# Patient Record
Sex: Male | Born: 1955 | Hispanic: No | Marital: Married | Smoking: Never smoker
Health system: Southern US, Community
[De-identification: ages and names within clinical notes are randomized; demographics above are authoritative.]

## PROBLEM LIST (undated history)

## (undated) DIAGNOSIS — R0600 Dyspnea, unspecified: Secondary | ICD-10-CM

## (undated) DIAGNOSIS — S82842A Displaced bimalleolar fracture of left lower leg, initial encounter for closed fracture: Secondary | ICD-10-CM

## (undated) DIAGNOSIS — E785 Hyperlipidemia, unspecified: Secondary | ICD-10-CM

## (undated) DIAGNOSIS — I10 Essential (primary) hypertension: Secondary | ICD-10-CM

## (undated) DIAGNOSIS — G4730 Sleep apnea, unspecified: Secondary | ICD-10-CM

## (undated) DIAGNOSIS — R0609 Other forms of dyspnea: Secondary | ICD-10-CM

## (undated) DIAGNOSIS — E119 Type 2 diabetes mellitus without complications: Secondary | ICD-10-CM

## (undated) HISTORY — DX: Essential (primary) hypertension: I10

## (undated) HISTORY — DX: Dyspnea, unspecified: R06.00

## (undated) HISTORY — DX: Type 2 diabetes mellitus without complications: E11.9

## (undated) HISTORY — DX: Other forms of dyspnea: R06.09

## (undated) HISTORY — DX: Hyperlipidemia, unspecified: E78.5

## (undated) HISTORY — PX: ANKLE SURGERY: SHX546

---

## 2006-10-06 ENCOUNTER — Ambulatory Visit: Admission: RE | Admit: 2006-10-06 | Discharge: 2006-10-06 | Payer: Self-pay | Admitting: Family Medicine

## 2006-10-18 ENCOUNTER — Ambulatory Visit: Payer: Self-pay | Admitting: Pulmonary Disease

## 2013-04-14 ENCOUNTER — Ambulatory Visit (INDEPENDENT_AMBULATORY_CARE_PROVIDER_SITE_OTHER): Payer: BC Managed Care – PPO | Admitting: Cardiovascular Disease

## 2013-04-14 ENCOUNTER — Encounter: Payer: Self-pay | Admitting: Cardiovascular Disease

## 2013-04-14 VITALS — BP 163/90 | HR 77 | Ht 71.0 in | Wt 265.0 lb

## 2013-04-14 DIAGNOSIS — E1159 Type 2 diabetes mellitus with other circulatory complications: Secondary | ICD-10-CM | POA: Insufficient documentation

## 2013-04-14 DIAGNOSIS — R0609 Other forms of dyspnea: Secondary | ICD-10-CM | POA: Insufficient documentation

## 2013-04-14 DIAGNOSIS — E785 Hyperlipidemia, unspecified: Secondary | ICD-10-CM

## 2013-04-14 DIAGNOSIS — E119 Type 2 diabetes mellitus without complications: Secondary | ICD-10-CM | POA: Insufficient documentation

## 2013-04-14 DIAGNOSIS — E1169 Type 2 diabetes mellitus with other specified complication: Secondary | ICD-10-CM | POA: Insufficient documentation

## 2013-04-14 DIAGNOSIS — I10 Essential (primary) hypertension: Secondary | ICD-10-CM | POA: Insufficient documentation

## 2013-04-14 NOTE — Assessment & Plan Note (Signed)
On statin therapy followed by his PCP 

## 2013-04-14 NOTE — Progress Notes (Signed)
04/14/2013 Meet Dowers   [birth year]  [id number]  Primary Physician Alois Cliche, PA-C Primary Cardiologist: Runell Gess MD Roseanne Reno   HPI:    Mr. Waterson a 57 year old moderately overweight married African male father of 4, grandfather to 2 grandchildren he works in the [employer]. He was referred by Dr. Lerry Liner at [facility] Medical clinic for evaluation of dyspnea on exertion. His cardiovascular risk factor profile is remarkable for family history with a father who died of heart-related issues age 44. He does not smoke. He does have a history of hypertension, diabetes and hyperlipidemia. He has never had a heart attack or stroke.    Current Outpatient Prescriptions  Medication Sig Dispense Refill  . amLODipine (NORVASC) 10 MG tablet Take 10 mg by mouth daily.      Marland Kitchen aspirin 81 MG tablet Take 81 mg by mouth daily.      Marland Kitchen atorvastatin (LIPITOR) 40 MG tablet Take 40 mg by mouth daily.      Marland Kitchen glimepiride (AMARYL) 2 MG tablet Take 2 mg by mouth daily before breakfast.      . lisinopril (PRINIVIL,ZESTRIL) 20 MG tablet Take 20 mg by mouth daily.      Marland Kitchen lisinopril-hydrochlorothiazide (PRINZIDE,ZESTORETIC) 20-25 MG per tablet Take 1 tablet by mouth daily.      Marland Kitchen terbinafine (LAMISIL) 250 MG tablet Take 250 mg by mouth daily.       No current facility-administered medications for this visit.    No Known Allergies  History   Social History  . Marital Status: Married    Spouse Name: N/A    Number of Children: N/A  . Years of Education: N/A   Occupational History  . Not on file.   Social History Main Topics  . Smoking status: Never Smoker   . Smokeless tobacco: Not on file  . Alcohol Use: Yes     Comment: 3-4 beers weekly  . Drug Use: No  . Sexual Activity: Not on file   Other Topics Concern  . Not on file   Social History Narrative  . No narrative on file     Review of Systems: General: negative for chills, fever, night sweats or  weight changes.  Cardiovascular: negative for chest pain, dyspnea on exertion, edema, orthopnea, palpitations, paroxysmal nocturnal dyspnea or shortness of breath Dermatological: negative for rash Respiratory: negative for cough or wheezing Urologic: negative for hematuria Abdominal: negative for nausea, vomiting, diarrhea, bright red blood per rectum, melena, or hematemesis Neurologic: negative for visual changes, syncope, or dizziness All other systems reviewed and are otherwise negative except as noted above.    Blood pressure 163/90, pulse 77, height 5\' 11"  (1.803 m), weight 265 lb (120.203 kg).  General appearance: alert and no distress Neck: no adenopathy, no carotid bruit, no JVD, supple, symmetrical, trachea midline and thyroid not enlarged, symmetric, no tenderness/mass/nodules Lungs: clear to auscultation bilaterally Heart: regular rate and rhythm, S1, S2 normal, no murmur, click, rub or gallop Abdomen: soft, non-tender; bowel sounds normal; no masses,  no organomegaly Extremities: extremities normal, atraumatic, no cyanosis or edema Pulses: 2+ and symmetric  EKG normal sinus rhythm at 77 with borderline evidence of left ventricular hypertrophy by voltage  ASSESSMENT AND PLAN:   Essential hypertension Borderline control on current medications. Talked about salt restriction dietary modification and exercise. At this point I'm not going to increase in his angina and hypertensive medications.  Hyperlipidemia On statin therapy followed by his PCP  Dyspnea on exertion  He has cardiac risk factors notable for hypertension, diabetes and hyperlipidemia. I'm going to get a 2-D echo to evaluate LV function.      Runell Gess MD FACP,FACC,FAHA, [facility] 04/14/2013 5:04 PM

## 2013-04-14 NOTE — Assessment & Plan Note (Signed)
Borderline control on current medications. Talked about salt restriction dietary modification and exercise. At this point I'm not going to increase in his angina and hypertensive medications.

## 2013-04-14 NOTE — Assessment & Plan Note (Signed)
He has cardiac risk factors notable for hypertension, diabetes and hyperlipidemia. I'm going to get a 2-D echo to evaluate LV function.

## 2013-04-14 NOTE — Patient Instructions (Signed)
Your physician wants you to follow-up in: 3 months with an extender and 6 months with Dr Allyson Sabal. You will receive a reminder letter in the mail two months in advance. If you don't receive a letter, please call our office to schedule the follow-up appointment.  Dr Allyson Sabal has ordered an echocardiogram.

## 2013-04-17 ENCOUNTER — Encounter: Payer: Self-pay | Admitting: Cardiovascular Disease

## 2013-05-06 ENCOUNTER — Ambulatory Visit (HOSPITAL_COMMUNITY)
Admission: RE | Admit: 2013-05-06 | Discharge: 2013-05-06 | Disposition: A | Discharge status: Home / Self Care | Payer: BC Managed Care – PPO | Source: Ambulatory Visit | Attending: Cardiovascular Disease | Admitting: Cardiovascular Disease

## 2013-05-06 DIAGNOSIS — R0989 Other specified symptoms and signs involving the circulatory and respiratory systems: Secondary | ICD-10-CM | POA: Insufficient documentation

## 2013-05-06 DIAGNOSIS — E119 Type 2 diabetes mellitus without complications: Secondary | ICD-10-CM | POA: Insufficient documentation

## 2013-05-06 DIAGNOSIS — R0609 Other forms of dyspnea: Secondary | ICD-10-CM | POA: Insufficient documentation

## 2013-05-06 DIAGNOSIS — I10 Essential (primary) hypertension: Secondary | ICD-10-CM | POA: Insufficient documentation

## 2013-05-06 DIAGNOSIS — R0602 Shortness of breath: Secondary | ICD-10-CM

## 2013-05-06 NOTE — Progress Notes (Signed)
2D Echo Performed 05/06/2013    Machai Desmith, RCS  

## 2013-05-13 ENCOUNTER — Encounter: Payer: Self-pay | Admitting: *Deleted

## 2013-05-14 ENCOUNTER — Telehealth: Payer: Self-pay | Admitting: *Deleted

## 2013-05-14 NOTE — Telephone Encounter (Signed)
Pt's wife called in regards to his test results. Stated that she would get a call in a couple of days but has not heard

## 2013-05-14 NOTE — Telephone Encounter (Signed)
wife return the call- Result given . Verbalized understanding Letter was mailed.

## 2013-05-14 NOTE — Telephone Encounter (Signed)
Left on message on voicemail  To call back  According to results - echo was essential normal and aletter was mailed om 05/14/13

## 2014-06-28 ENCOUNTER — Emergency Department (HOSPITAL_COMMUNITY): Payer: BLUE CROSS/BLUE SHIELD

## 2014-06-28 ENCOUNTER — Encounter (HOSPITAL_COMMUNITY): Payer: Self-pay | Admitting: *Deleted

## 2014-06-28 ENCOUNTER — Emergency Department (HOSPITAL_COMMUNITY)
Admission: EM | Admit: 2014-06-28 | Discharge: 2014-06-28 | Disposition: A | Discharge status: Home / Self Care | Payer: BLUE CROSS/BLUE SHIELD | Attending: Emergency Medicine | Admitting: Emergency Medicine

## 2014-06-28 DIAGNOSIS — S99912A Unspecified injury of left ankle, initial encounter: Secondary | ICD-10-CM | POA: Diagnosis present

## 2014-06-28 DIAGNOSIS — Z7982 Long term (current) use of aspirin: Secondary | ICD-10-CM | POA: Insufficient documentation

## 2014-06-28 DIAGNOSIS — Y9389 Activity, other specified: Secondary | ICD-10-CM | POA: Diagnosis not present

## 2014-06-28 DIAGNOSIS — S82832A Other fracture of upper and lower end of left fibula, initial encounter for closed fracture: Secondary | ICD-10-CM | POA: Insufficient documentation

## 2014-06-28 DIAGNOSIS — S82302A Unspecified fracture of lower end of left tibia, initial encounter for closed fracture: Secondary | ICD-10-CM | POA: Diagnosis not present

## 2014-06-28 DIAGNOSIS — E119 Type 2 diabetes mellitus without complications: Secondary | ICD-10-CM | POA: Diagnosis not present

## 2014-06-28 DIAGNOSIS — E785 Hyperlipidemia, unspecified: Secondary | ICD-10-CM | POA: Diagnosis not present

## 2014-06-28 DIAGNOSIS — Y92096 Garden or yard of other non-institutional residence as the place of occurrence of the external cause: Secondary | ICD-10-CM | POA: Diagnosis not present

## 2014-06-28 DIAGNOSIS — I10 Essential (primary) hypertension: Secondary | ICD-10-CM | POA: Insufficient documentation

## 2014-06-28 DIAGNOSIS — W010XXA Fall on same level from slipping, tripping and stumbling without subsequent striking against object, initial encounter: Secondary | ICD-10-CM | POA: Diagnosis not present

## 2014-06-28 DIAGNOSIS — Z79899 Other long term (current) drug therapy: Secondary | ICD-10-CM | POA: Insufficient documentation

## 2014-06-28 DIAGNOSIS — Y990 Civilian activity done for income or pay: Secondary | ICD-10-CM | POA: Diagnosis not present

## 2014-06-28 MED ORDER — OXYCODONE-ACETAMINOPHEN 5-325 MG PO TABS: 1.0000 | ORAL_TABLET | Freq: Four times a day (QID) | ORAL | Status: DC | PRN

## 2014-06-28 MED ORDER — OXYCODONE-ACETAMINOPHEN 5-325 MG PO TABS
1.0000 | ORAL_TABLET | Freq: Once | ORAL | Status: AC
  Administered 2014-06-28: 1 via ORAL
  Filled 2014-06-28: qty 1

## 2014-06-28 MED ORDER — HYDROCODONE-ACETAMINOPHEN 5-325 MG PO TABS
1.0000 | ORAL_TABLET | Freq: Once | ORAL | Status: AC
  Administered 2014-06-28: 1 via ORAL
  Filled 2014-06-28: qty 1

## 2014-06-28 NOTE — ED Notes (Signed)
Declined W/C at D/C and was escorted to lobby by RN. 

## 2014-06-28 NOTE — ED Provider Notes (Signed)
CSN: [id number]     Arrival date & time 06/28/14  1302 History  This chart was scribed for Fayrene HelperBowie Jayleana Colberg, PA-C, working with Candyce ChurnJohn David Wofford III, * by Chestine SporeSoijett Blue, ED Scribe. The patient was seen in room TR10C/TR10C at 2:21 PM.    Chief Complaint  Patient presents with  . Fall  . Ankle Pain     The history is provided by the patient. No language interpreter was used.    HPI Comments: Zachary Stephenson is a 59 y.o. male who presents to the Emergency Department complaining of fall onset this morning PTA. He reports that he tripped and fell this morning while doing yardwork on a sloped yard. He notes that he twisted his left ankle. He states that he is having associated symptoms of left ankle pain and joint swelling. He is now having increased pain with ambulation and that is what prompted him to come in. He states that he has not tried any medications for the relief of his symptoms. He denies hitting his head, LOC, and any other symptoms.   Past Medical History  Diagnosis Date  . HTN (hypertension)   . Hyperlipidemia   . Type 2 diabetes mellitus   . Dyspnea on exertion    Past Surgical History  Procedure Laterality Date  . Ankle surgery      right ankle   Family History  Problem Relation Age of Onset  . Heart attack Father    History  Substance Use Topics  . Smoking status: Never Smoker   . Smokeless tobacco: Not on file  . Alcohol Use: Yes     Comment: 3-4 beers weekly    Review of Systems  Musculoskeletal: Positive for myalgias, joint swelling and arthralgias.  Neurological: Negative for syncope.      Allergies  Review of patient's allergies indicates no known allergies.  Home Medications   Prior to Admission medications   Medication Sig Start Date End Date Taking? Authorizing Provider  amLODipine (NORVASC) 10 MG tablet Take 10 mg by mouth daily.    Historical Provider, MD  aspirin 81 MG tablet Take 81 mg by mouth daily.    Historical Provider, MD  atorvastatin  (LIPITOR) 40 MG tablet Take 40 mg by mouth daily.    Historical Provider, MD  glimepiride (AMARYL) 2 MG tablet Take 2 mg by mouth daily before breakfast.    Historical Provider, MD  lisinopril (PRINIVIL,ZESTRIL) 20 MG tablet Take 20 mg by mouth daily.    Historical Provider, MD  lisinopril-hydrochlorothiazide (PRINZIDE,ZESTORETIC) 20-25 MG per tablet Take 1 tablet by mouth daily.    Historical Provider, MD  terbinafine (LAMISIL) 250 MG tablet Take 250 mg by mouth daily.    Historical Provider, MD   BP 148/72 mmHg  Pulse 100  Temp(Src) 98 F (36.7 C) (Oral)  Resp 18  SpO2 97%  Physical Exam  Constitutional: He is oriented to person, place, and time. He appears well-developed and well-nourished. No distress.  HENT:  Head: Normocephalic and atraumatic.  Eyes: EOM are normal.  Neck: Neck supple. No tracheal deviation present.  Cardiovascular: Normal rate.   Pulmonary/Chest: Effort normal. No respiratory distress.  Musculoskeletal: Normal range of motion.       Left ankle: He exhibits normal range of motion, no swelling, no ecchymosis and no deformity. Tenderness. Lateral malleolus tenderness found.  Left ankle: No swelling, bruising or obvious deformity to the left ankle. TTP on the lateral malleolus. Full ROM of the left ankle. Increased pain with lateral  movements. NVI.   Neurological: He is alert and oriented to person, place, and time.  Skin: Skin is warm and dry.  Psychiatric: He has a normal mood and affect. His behavior is normal.  Nursing note and vitals reviewed.   ED Course  Procedures (including critical care time) DIAGNOSTIC STUDIES: Oxygen Saturation is 97% on room air, normal by my interpretation.    COORDINATION OF CARE: 2:24 PM-Discussed treatment plan which includes f/u with orthopedics, splint, rest, elevate with pt at bedside and pt agreed to plan.   4:14 PM Xray demonstrate ankle fx.  Splint was placed, pt to f/u with ortho for further care.  No weight bearing.    Labs Review Labs Reviewed - No data to display  Imaging Review Dg Ankle Complete Left  06/28/2014   CLINICAL DATA:  Left ankle pain, injury today.  Lateral pain.  EXAM: LEFT ANKLE COMPLETE - 3+ VIEW  COMPARISON:  None.  FINDINGS: Lateral soft tissue swelling. There is a nondisplaced oblique fracture through the distal left fibula, best seen on the lateral view. Small avulsion fracture off the tip of the medial malleolus. Lateral soft tissue swelling. Ankle mortise is intact.  IMPRESSION: Oblique nondisplaced distal left fibular fracture. Avulsion fracture at the tip of the medial malleolus.   Electronically Signed   By: Charlett Nose M.D.   On: 06/28/2014 14:08     EKG Interpretation None      MDM   Final diagnoses:  Closed fracture of distal end of fibula with tibia, left, initial encounter    BP 148/72 mmHg  Pulse 100  Temp(Src) 98 F (36.7 C) (Oral)  Resp 18  SpO2 97%  I have reviewed nursing notes and vital signs. I personally reviewed the imaging tests through PACS system  I reviewed available ER/hospitalization records thought the EMR  I personally performed the services described in this documentation, which was scribed in my presence. The recorded information has been reviewed and is accurate.    Fayrene Helper, PA-C 06/28/14 1614  Candyce Churn III, MD 06/28/14 [phone]

## 2014-06-28 NOTE — Discharge Instructions (Signed)
You have broken your left ankle.  Do not bear any weight and keep your ankle elevated as much as possible.  Follow up closely with orthopedic specialist for further management. Take pain medication as needed.    Fractura de tobillo (Ankle Fracture) Una fractura es la ruptura de un hueso. La articulacin del tobillo est compuesta por tres huesos. Estos incluyen las secciones inferiores (distales) de los huesos de la extremidad inferior, llamados tibia y peron, junto con un hueso del pie, Engineering geologistllamado astrgalo. En funcin de la gravedad de la fractura y si hay ms de un hueso de la articulacin del tobillo fracturado, se [place]a un yeso o una frula para proteger el hueso fracturado e impedir que este se [place]mueva mientras se suelda. A veces, es necesario realizar la ciruga para ayudar a que la fractura suelde correctamente.  Hay dos tipos generales de fracturas:  Fractura estable. En este tipo de fractura hay una sola lnea de la fractura que atraviesa un hueso sin lesiones en los ligamentos del tobillo. La fractura del astrgalo sin desplazamiento (movimiento del hueso hacia uno de los lados de la lnea de la fractura) tambin es estable.  Fractura inestable. En este tipo de fractura hay ms de una lnea de la fractura que atraviesan uno o ms huesos de la articulacin del tobillo. Tambin incluye las fracturas con desplazamiento del hueso hacia uno de los lados de la lnea de Printmakerla fractura. CAUSAS  Un golpe directo en el tobillo.  Una torcedura rpida y grave del tobillo.  Un traumatismo, como un accidente automovilstico o una cada de una altura importante. FACTORES DE RIESGO Puede tener un riesgo ms alto de sufrir una fractura de tobillo si:  Tiene ciertas enfermedades crnicas.  Practica deportes de alto impacto.  Tiene un accidente automovilstico de alto impacto. SIGNOS Y SNTOMAS   Dolor e hinchazn en el tobillo.  Hematomas alrededor del tobillo lesionado.  Dolor al Science writermover el  tobillo.  Dificultad para caminar o para soportar peso en el tobillo.  Pie fro por debajo del lugar de la lesin del tobillo. Esto puede ocurrir si los vasos sanguneos que atraviesan el tobillo lesionado tambin se daaron.  Adormecimiento del pie por debajo del lugar de la lesin del tobillo. DIAGNSTICO  Generalmente, la fractura de tobillo se diagnostica mediante un examen fsico y radiografas. Tambin puede ser necesario realizar una tomografa computarizada si la fractura es compleja. TRATAMIENTO  Para tratar las fracturas estables, se coloca un yeso o una frula y se usan muletas para no recargar el tobillo lesionado. A esto le sigue un programa de fortalecimiento del tobillo. Algunos pacientes necesitan un tipo especial de yeso, en funcin de otros problemas mdicos que pueden tener. Las fracturas inestables requieren Azerbaijanciruga para asegurarse de [employer] los huesos se suelden correctamente. El Office Depotmdico le informar qu tipo de fractura tiene y cul es el mejor tratamiento para su afeccin. INSTRUCCIONES PARA EL CUIDADO EN EL HOGAR   Revise con el mdico cul es la mejor forma de usar las Arcolamuletas y selas como se lo indiquen. El uso seguro de las muletas es muy importante. El uso indebido de las Murphy Oilmuletas puede provocarle cadas o causar lesiones en los nervios de las manos o las [place]axilas.  No recargue ni ejerza presin en el tobillo lesionado hasta tanto el mdico se lo indique.  Para disminuir la hinchazn, mantenga elevada la pierna lesionada mientras est sentado o acostado.  Aplique hielo sobre la zona lesionada.  Ponga el hielo en Lucile Shuttersuna bolsa  plstica.  Coloque una toalla entre el yeso y la bolsa de hielo.  Deje el hielo durante 20 minutos, 2 a 3 veces por da.  Si le colocaron un yeso o un molde de Alamillo de vidrio:  No trate de rascarse la piel por debajo del yeso con ningn objeto. Esto puede aumentar el riesgo de infecciones cutneas.  Controle todos los [employer]aur[employer] piel de alrededor  del yeso. Puede colocarse una locin en las zonas rojas o doloridas.  Mantenga el yeso seco y limpio.  Si tiene una frula de yeso:  Se la frula del modo en que se lo indicaron.  Puede aflojar el elstico que rodea la frula si los dedos se entumecen, siente hormigueos, se enfran o se vuelven de color azul.  No ejerza presin en ninguna parte del yeso o frula; podra romperse. Durante las primeras 24 horas mantenga el yeso sobre una almohada hasta que est completamente duro.  Es posible proteger el yeso o la frula durante el bao con una bolsa de plstico sellada sobre la piel con [place]. No los sumerja en el agua.  Tome todos los medicamentos como le indic el mdico. Utilice los medicamentos de venta libre o recetados para Primary school teacher, Environmental health practitioner o la fiebre, segn se lo indique el mdico.  No conduzca vehculos hasta que el mdico le diga especficamente que puede hacerlo con seguridad.  Si el mdico le ha dado fecha para una visita de control, es importante que concurra. No concurrir a la visita puede derivar en que el dao, el dolor o la discapacidad sean permanentes o crnicos. Si tiene problemas para cumplir con la visita, llame al centro para pedir ayuda. SOLICITE ATENCIN MDICA SI: Aumenta la hinchazn o la molestia. SOLICITE ATENCIN MDICA DE INMEDIATO SI:   Su yeso se daa o se rompe.  Tiene dolor intenso y continuo.  Siente un nuevo dolor o presenta hinchazn despus de la colocacin del yeso.  La piel o las uas del pie que estn por debajo de la lesin se le ponen azules o grises.  La piel o las uas del pie que estn por debajo de la lesin estn fras, adormecidas o pierde la sensibilidad al tacto.  Siente mal olor u observa secrecin debajo del yeso. ASEGRESE DE QUE:   Comprende estas instrucciones.  Controlar su afeccin.  Recibir ayuda de inmediato si no mejora o si empeora. Document Released: 05/29/2005 Document Revised:  06/03/2013 [facility] Patient Information 2015 [place], [place]. This information is not intended to replace advice given to you by your health care provider. Make sure you discuss any questions you have with your health care provider.

## 2014-06-28 NOTE — Progress Notes (Signed)
Orthopedic Tech Progress Note Patient Details:  Zachary ClicheRenso Stephenson [birth year] [id number] Applied fiberglass posterior short leg splint and fiberglass stirrup splint to LLE.  Pulses, sensation, motion intact before and after splinting.  Capillary refill less than 2 seconds before and after splinting. Ortho Devices Type of Ortho Device: Stirrup splint, Post (short leg) splint Ortho Device/Splint Location: LLE Ortho Device/Splint Interventions: Application   [name], [name] L 06/28/2014, 3:03 PM

## 2014-06-28 NOTE — ED Notes (Signed)
PA at the bedside.

## 2014-06-28 NOTE — ED Notes (Signed)
Pt reports tripping and falling this am, twisted left ankle and now having pain and swelling.

## 2014-06-28 NOTE — ED Notes (Addendum)
Paged Ortho Tech. Ortho Tech on his way.

## 2014-06-28 NOTE — ED Notes (Signed)
Ortho tech at bedside 

## 2014-06-30 ENCOUNTER — Encounter (HOSPITAL_BASED_OUTPATIENT_CLINIC_OR_DEPARTMENT_OTHER): Payer: Self-pay | Admitting: *Deleted

## 2014-07-02 ENCOUNTER — Encounter (HOSPITAL_BASED_OUTPATIENT_CLINIC_OR_DEPARTMENT_OTHER)
Admission: RE | Admit: 2014-07-02 | Discharge: 2014-07-02 | Disposition: A | Discharge status: Home / Self Care | Payer: BLUE CROSS/BLUE SHIELD | Source: Ambulatory Visit | Attending: Orthopaedic Surgery | Admitting: Orthopaedic Surgery

## 2014-07-02 ENCOUNTER — Other Ambulatory Visit: Payer: Self-pay

## 2014-07-02 ENCOUNTER — Other Ambulatory Visit (HOSPITAL_COMMUNITY): Payer: Self-pay | Admitting: Orthopaedic Surgery

## 2014-07-02 DIAGNOSIS — S82842A Displaced bimalleolar fracture of left lower leg, initial encounter for closed fracture: Secondary | ICD-10-CM | POA: Diagnosis not present

## 2014-07-02 DIAGNOSIS — Z01818 Encounter for other preprocedural examination: Secondary | ICD-10-CM | POA: Insufficient documentation

## 2014-07-02 LAB — BASIC METABOLIC PANEL
Anion gap: 8 (ref 5–15)
BUN: 12 mg/dL (ref 6–23)
CALCIUM: 9.6 mg/dL (ref 8.4–10.5)
CO2: 30 mmol/L (ref 19–32)
Chloride: 100 mEq/L (ref 96–112)
Creatinine, Ser: 0.87 mg/dL (ref 0.50–1.35)
GFR calc Af Amer: >90 mL/min (ref >90)
GFR calc non Af Amer: >90 mL/min (ref >90)
Glucose, Bld: 130 mg/dL — ABNORMAL HIGH (ref 70–99)
Potassium: 4.4 mmol/L (ref 3.5–5.1)
Sodium: 138 mmol/L (ref 135–145)

## 2014-07-02 NOTE — Pre-Procedure Instructions (Signed)
Mariel will be interpreter for pt., per Judy at [facility].  Please call [phone] if surgery time changes. 

## 2014-07-05 NOTE — H&P (Signed)
PREOPERATIVE H&P  Chief Complaint: Left bimalleolar ankle fracture  HPI: Zachary Stephenson is a 59 y.o. male who presents for surgical treatment of Left bimalleolar ankle fracture.  He denies any changes in medical history.  Past Medical History  Diagnosis Date  . HTN (hypertension)   . Hyperlipidemia   . Dyspnea on exertion   . Sleep apnea      supposed to use CPAP, does not. Will bring DOS  . Type 2 diabetes mellitus     no meds at present  . Bimalleolar fracture of left ankle    Past Surgical History  Procedure Laterality Date  . Ankle surgery      right ankle   History   Social History  . Marital Status: Married    Spouse Name: N/A    Number of Children: N/A  . Years of Education: N/A   Social History Main Topics  . Smoking status: Never Smoker   . Smokeless tobacco: None  . Alcohol Use: Yes     Comment: 3-4 beers weekly  . Drug Use: No  . Sexual Activity: None   Other Topics Concern  . None   Social History Narrative   Family History  Problem Relation Age of Onset  . Heart attack Father    No Known Allergies Prior to Admission medications   Medication Sig Start Date End Date Taking? Authorizing Provider  amLODipine (NORVASC) 10 MG tablet Take 10 mg by mouth daily.   Yes Historical Provider, MD  aspirin 81 MG tablet Take 81 mg by mouth daily.   Yes Historical Provider, MD  lisinopril (PRINIVIL,ZESTRIL) 20 MG tablet Take 20 mg by mouth daily.   Yes Historical Provider, MD  lisinopril-hydrochlorothiazide (PRINZIDE,ZESTORETIC) 20-25 MG per tablet Take 1 tablet by mouth daily.   Yes Historical Provider, MD  oxyCODONE-acetaminophen (PERCOCET/ROXICET) 5-325 MG per tablet Take 1 tablet by mouth every 6 (six) hours as needed for severe pain. 06/28/14  Yes Fayrene HelperBowie Tran, PA-C     Positive ROS: All other systems have been reviewed and were otherwise negative with the exception of those mentioned in the HPI and as above.  Physical Exam: General: Alert, no acute  distress Cardiovascular: No pedal edema Respiratory: No cyanosis, no use of accessory musculature GI: abdomen soft Skin: No lesions in the area of chief complaint Neurologic: Sensation intact distally Psychiatric: Patient is competent for consent with normal mood and affect Lymphatic: no lymphedema  MUSCULOSKELETAL: exam stable  Assessment: Left bimalleolar ankle fracture  Plan: Plan for Procedure(s): OPEN REDUCTION INTERNAL FIXATION (ORIF) LEFT BIMALLEOLAR ANKLE FRACTURE  The risks benefits and alternatives were discussed with the patient including but not limited to the risks of nonoperative treatment, versus surgical intervention including infection, bleeding, nerve injury,  blood clots, cardiopulmonary complications, morbidity, mortality, among others, and they were willing to proceed.   Cheral AlmasXu, Manasi Dishon Michael, MD   07/05/2014 4:57 PM

## 2014-07-06 ENCOUNTER — Ambulatory Visit (HOSPITAL_BASED_OUTPATIENT_CLINIC_OR_DEPARTMENT_OTHER): Payer: BLUE CROSS/BLUE SHIELD | Admitting: Certified Registered"

## 2014-07-06 ENCOUNTER — Ambulatory Visit (HOSPITAL_COMMUNITY): Payer: BLUE CROSS/BLUE SHIELD

## 2014-07-06 ENCOUNTER — Encounter (HOSPITAL_BASED_OUTPATIENT_CLINIC_OR_DEPARTMENT_OTHER): Payer: Self-pay | Admitting: Certified Registered"

## 2014-07-06 ENCOUNTER — Encounter (HOSPITAL_BASED_OUTPATIENT_CLINIC_OR_DEPARTMENT_OTHER)
Admission: RE | Disposition: A | Discharge status: Home / Self Care | Payer: Self-pay | Source: Ambulatory Visit | Attending: Orthopaedic Surgery

## 2014-07-06 ENCOUNTER — Ambulatory Visit (HOSPITAL_BASED_OUTPATIENT_CLINIC_OR_DEPARTMENT_OTHER)
Admission: RE | Admit: 2014-07-06 | Discharge: 2014-07-06 | Disposition: A | Discharge status: Home / Self Care | Payer: BLUE CROSS/BLUE SHIELD | Source: Ambulatory Visit | Attending: Orthopaedic Surgery | Admitting: Orthopaedic Surgery

## 2014-07-06 DIAGNOSIS — S82842A Displaced bimalleolar fracture of left lower leg, initial encounter for closed fracture: Secondary | ICD-10-CM | POA: Insufficient documentation

## 2014-07-06 DIAGNOSIS — X58XXXA Exposure to other specified factors, initial encounter: Secondary | ICD-10-CM | POA: Diagnosis not present

## 2014-07-06 DIAGNOSIS — G4730 Sleep apnea, unspecified: Secondary | ICD-10-CM | POA: Diagnosis not present

## 2014-07-06 DIAGNOSIS — E785 Hyperlipidemia, unspecified: Secondary | ICD-10-CM | POA: Insufficient documentation

## 2014-07-06 DIAGNOSIS — Y939 Activity, unspecified: Secondary | ICD-10-CM | POA: Insufficient documentation

## 2014-07-06 DIAGNOSIS — Z7982 Long term (current) use of aspirin: Secondary | ICD-10-CM | POA: Diagnosis not present

## 2014-07-06 DIAGNOSIS — E119 Type 2 diabetes mellitus without complications: Secondary | ICD-10-CM | POA: Insufficient documentation

## 2014-07-06 DIAGNOSIS — I10 Essential (primary) hypertension: Secondary | ICD-10-CM | POA: Diagnosis not present

## 2014-07-06 DIAGNOSIS — T148XXA Other injury of unspecified body region, initial encounter: Secondary | ICD-10-CM

## 2014-07-06 DIAGNOSIS — Y929 Unspecified place or not applicable: Secondary | ICD-10-CM | POA: Insufficient documentation

## 2014-07-06 HISTORY — DX: Sleep apnea, unspecified: G47.30

## 2014-07-06 HISTORY — DX: Displaced bimalleolar fracture of left lower leg, initial encounter for closed fracture: S82.842A

## 2014-07-06 HISTORY — PX: ORIF ANKLE FRACTURE: SHX5408

## 2014-07-06 LAB — POCT HEMOGLOBIN-HEMACUE: Hemoglobin: 14.2 g/dL (ref 13.0–17.0)

## 2014-07-06 SURGERY — OPEN REDUCTION INTERNAL FIXATION (ORIF) ANKLE FRACTURE
Anesthesia: General | Site: Ankle | Laterality: Left

## 2014-07-06 MED ORDER — PROPOFOL 10 MG/ML IV BOLUS
INTRAVENOUS | Status: DC | PRN
  Administered 2014-07-06: 200 mg via INTRAVENOUS

## 2014-07-06 MED ORDER — MEPERIDINE HCL 25 MG/ML IJ SOLN: 6.2500 mg | INTRAMUSCULAR | Status: DC | PRN

## 2014-07-06 MED ORDER — FENTANYL CITRATE 0.05 MG/ML IJ SOLN
50.0000 ug | INTRAMUSCULAR | Status: DC | PRN
  Administered 2014-07-06: 100 ug via INTRAVENOUS

## 2014-07-06 MED ORDER — DEXTROSE 5 % IV SOLN
3.0000 g | INTRAVENOUS | Status: AC
  Administered 2014-07-06: 3 g via INTRAVENOUS

## 2014-07-06 MED ORDER — MIDAZOLAM HCL 2 MG/2ML IJ SOLN
INTRAMUSCULAR | Status: AC
  Filled 2014-07-06: qty 2

## 2014-07-06 MED ORDER — LACTATED RINGERS IV SOLN
INTRAVENOUS | Status: DC
  Administered 2014-07-06: 14:00:00 via INTRAVENOUS

## 2014-07-06 MED ORDER — ONDANSETRON HCL 4 MG/2ML IJ SOLN: 4.0000 mg | Freq: Once | INTRAMUSCULAR | Status: DC | PRN

## 2014-07-06 MED ORDER — ONDANSETRON HCL 4 MG/2ML IJ SOLN
INTRAMUSCULAR | Status: DC | PRN
Start: 2014-07-06 — End: 2014-07-06
  Administered 2014-07-06: 4 mg via INTRAVENOUS

## 2014-07-06 MED ORDER — FENTANYL CITRATE 0.05 MG/ML IJ SOLN
INTRAMUSCULAR | Status: AC
  Filled 2014-07-06: qty 2

## 2014-07-06 MED ORDER — CEFAZOLIN SODIUM 1-5 GM-% IV SOLN
INTRAVENOUS | Status: AC
  Filled 2014-07-06: qty 100

## 2014-07-06 MED ORDER — DEXAMETHASONE SODIUM PHOSPHATE 10 MG/ML IJ SOLN
INTRAMUSCULAR | Status: DC | PRN
  Administered 2014-07-06: 10 mg via INTRAVENOUS

## 2014-07-06 MED ORDER — 0.9 % SODIUM CHLORIDE (POUR BTL) OPTIME
TOPICAL | Status: DC | PRN
  Administered 2014-07-06: 1000 mL

## 2014-07-06 MED ORDER — MIDAZOLAM HCL 2 MG/ML PO SYRP: 0.5000 mg/kg | ORAL_SOLUTION | Freq: Once | ORAL | Status: DC | PRN

## 2014-07-06 MED ORDER — MIDAZOLAM HCL 5 MG/5ML IJ SOLN
INTRAMUSCULAR | Status: DC | PRN
  Administered 2014-07-06: 1 mg via INTRAVENOUS

## 2014-07-06 MED ORDER — MIDAZOLAM HCL 2 MG/2ML IJ SOLN
1.0000 mg | INTRAMUSCULAR | Status: DC | PRN
  Administered 2014-07-06: 2 mg via INTRAVENOUS

## 2014-07-06 MED ORDER — ASPIRIN EC 325 MG PO TBEC: 325.0000 mg | DELAYED_RELEASE_TABLET | Freq: Two times a day (BID) | ORAL | Status: DC

## 2014-07-06 MED ORDER — HYDROMORPHONE HCL 1 MG/ML IJ SOLN: 0.2500 mg | INTRAMUSCULAR | Status: DC | PRN

## 2014-07-06 MED ORDER — OXYCODONE HCL 5 MG PO TABS: 5.0000 mg | ORAL_TABLET | ORAL | Status: DC | PRN

## 2014-07-06 MED ORDER — LIDOCAINE HCL (CARDIAC) 20 MG/ML IV SOLN
INTRAVENOUS | Status: DC | PRN
  Administered 2014-07-06: 100 mg via INTRAVENOUS

## 2014-07-06 MED ORDER — FENTANYL CITRATE 0.05 MG/ML IJ SOLN
INTRAMUSCULAR | Status: AC
  Filled 2014-07-06: qty 6

## 2014-07-06 SURGICAL SUPPLY — 86 items
20mm x 3.5mm locking screw (Screw) ×2 IMPLANT
22mm x 3.5mm bone screw (Screw) ×2 IMPLANT
7 hole 1/3 Tubular Plate (Plate) ×2 IMPLANT
BANDAGE ELASTIC 3 VELCRO ST LF (GAUZE/BANDAGES/DRESSINGS) IMPLANT
BANDAGE ELASTIC 4 VELCRO ST LF (GAUZE/BANDAGES/DRESSINGS) IMPLANT
BANDAGE ELASTIC 6 VELCRO ST LF (GAUZE/BANDAGES/DRESSINGS) ×4 IMPLANT
BANDAGE ESMARK 6X9 LF (GAUZE/BANDAGES/DRESSINGS) ×1 IMPLANT
BIT DRILL 3.5X122MM AO FIT (BIT) ×2 IMPLANT
BLADE SURG 10 STRL SS (BLADE) ×1 IMPLANT
BLADE SURG 15 STRL LF DISP TIS (BLADE) ×2 IMPLANT
BLADE SURG 15 STRL SS (BLADE) ×6
BNDG CMPR 9X6 STRL LF SNTH (GAUZE/BANDAGES/DRESSINGS) ×1
BNDG ESMARK 6X9 LF (GAUZE/BANDAGES/DRESSINGS) ×3
BOOT STEPPER DURA LG (SOFTGOODS) IMPLANT
BOOT STEPPER DURA MED (SOFTGOODS) IMPLANT
BOOT STEPPER DURA SM (SOFTGOODS) IMPLANT
CANISTER SUCT 1200ML W/VALVE (MISCELLANEOUS) ×3 IMPLANT
COVER BACK TABLE 60X90IN (DRAPES) ×3 IMPLANT
CUFF TOURNIQUET SINGLE 24IN (TOURNIQUET CUFF) IMPLANT
CUFF TOURNIQUET SINGLE 34IN LL (TOURNIQUET CUFF) ×2 IMPLANT
DECANTER SPIKE VIAL GLASS SM (MISCELLANEOUS) IMPLANT
DRAPE C-ARM 42X72 X-RAY (DRAPES) ×3 IMPLANT
DRAPE C-ARMOR (DRAPES) ×3 IMPLANT
DRAPE EXTREMITY T 121X128X90 (DRAPE) ×3 IMPLANT
DRAPE OEC MINIVIEW 54X84 (DRAPES) IMPLANT
DRAPE SURG 17X23 STRL (DRAPES) ×2 IMPLANT
DRAPE U 20/CS (DRAPES) ×3 IMPLANT
DRAPE U-SHAPE 47X51 STRL (DRAPES) ×2 IMPLANT
DRILL 2.6X122MM WL AO SHAFT (BIT) ×2 IMPLANT
DURAPREP 26ML APPLICATOR (WOUND CARE) ×5 IMPLANT
ELECT REM PT RETURN 9FT ADLT (ELECTROSURGICAL) ×3
ELECTRODE REM PT RTRN 9FT ADLT (ELECTROSURGICAL) ×1 IMPLANT
GAUZE SPONGE 4X4 12PLY STRL (GAUZE/BANDAGES/DRESSINGS) ×3 IMPLANT
GAUZE SPONGE 4X4 16PLY XRAY LF (GAUZE/BANDAGES/DRESSINGS) ×2 IMPLANT
GAUZE XEROFORM 1X8 LF (GAUZE/BANDAGES/DRESSINGS) ×3 IMPLANT
GLOVE NEODERM STRL 7.5 LF PF (GLOVE) ×1 IMPLANT
GLOVE SURG NEODERM 7.5  LF PF (GLOVE) ×2
GLOVE SURG SYN 7.5  E (GLOVE) ×2
GLOVE SURG SYN 7.5 E (GLOVE) ×1 IMPLANT
GLOVE SURG SYN 7.5 PF PI (GLOVE) ×1 IMPLANT
GOWN STRL REIN XL XLG (GOWN DISPOSABLE) ×3 IMPLANT
GOWN STRL REUS W/ TWL LRG LVL3 (GOWN DISPOSABLE) ×1 IMPLANT
GOWN STRL REUS W/TWL LRG LVL3 (GOWN DISPOSABLE) ×9
NEEDLE HYPO 22GX1.5 SAFETY (NEEDLE) IMPLANT
NS IRRIG 1000ML POUR BTL (IV SOLUTION) ×3 IMPLANT
PACK BASIN DAY SURGERY FS (CUSTOM PROCEDURE TRAY) ×3 IMPLANT
PAD CAST 3X4 CTTN HI CHSV (CAST SUPPLIES) IMPLANT
PAD CAST 4YDX4 CTTN HI CHSV (CAST SUPPLIES) IMPLANT
PADDING CAST ABS 4INX4YD NS (CAST SUPPLIES) ×2
PADDING CAST ABS COTTON 4X4 ST (CAST SUPPLIES) ×1 IMPLANT
PADDING CAST COTTON 3X4 STRL (CAST SUPPLIES)
PADDING CAST COTTON 4X4 STRL (CAST SUPPLIES)
PADDING CAST COTTON 6X4 STRL (CAST SUPPLIES) ×6 IMPLANT
PADDING UNDERCAST 2 STRL (CAST SUPPLIES)
PADDING UNDERCAST 2X4 STRL (CAST SUPPLIES) IMPLANT
PENCIL BUTTON HOLSTER BLD 10FT (ELECTRODE) ×3 IMPLANT
SCREW BONE 14MMX3.5MM (Screw) ×4 IMPLANT
SCREW BONE 3.5X16MM (Screw) ×2 IMPLANT
SCREW BONE NON-LCKING 3.5X12MM (Screw) ×2 IMPLANT
SCREW LOCKING 3.5X16MM (Screw) ×2 IMPLANT
SLEEVE SCD COMPRESS KNEE MED (MISCELLANEOUS) ×2 IMPLANT
SPLINT FAST PLASTER 5X30 (CAST SUPPLIES)
SPLINT FIBERGLASS 4X30 (CAST SUPPLIES) ×2 IMPLANT
SPLINT PLASTER CAST FAST 5X30 (CAST SUPPLIES) IMPLANT
SPONGE LAP 18X18 X RAY DECT (DISPOSABLE) ×2 IMPLANT
SPONGE LAP 4X18 X RAY DECT (DISPOSABLE) IMPLANT
STAPLER VISISTAT (STAPLE) IMPLANT
STAPLER VISISTAT 35W (STAPLE) IMPLANT
SUCTION FRAZIER TIP 10 FR DISP (SUCTIONS) ×3 IMPLANT
SUT ETHILON 3 0 FSL (SUTURE) ×4 IMPLANT
SUT ETHILON 3 0 PS 1 (SUTURE) IMPLANT
SUT ETHILON 4 0 PS 2 18 (SUTURE) IMPLANT
SUT TICRON 1 T 12 (SUTURE) IMPLANT
SUT VIC AB 2-0 CT1 27 (SUTURE) ×6
SUT VIC AB 2-0 CT1 TAPERPNT 27 (SUTURE) IMPLANT
SUT VIC AB 2-0 SH 27 (SUTURE) ×3
SUT VIC AB 2-0 SH 27XBRD (SUTURE) IMPLANT
SUT VIC AB 3-0 SH 27 (SUTURE) ×3
SUT VIC AB 3-0 SH 27X BRD (SUTURE) ×1 IMPLANT
SYR BULB 3OZ (MISCELLANEOUS) ×3 IMPLANT
SYR CONTROL 10ML LL (SYRINGE) IMPLANT
TOWEL OR 17X24 6PK STRL BLUE (TOWEL DISPOSABLE) ×3 IMPLANT
TUBE CONNECTING 20'X1/4 (TUBING) ×1
TUBE CONNECTING 20X1/4 (TUBING) ×2 IMPLANT
UNDERPAD 30X30 INCONTINENT (UNDERPADS AND DIAPERS) ×3 IMPLANT
YANKAUER SUCT BULB TIP NO VENT (SUCTIONS) ×2 IMPLANT

## 2014-07-06 NOTE — Discharge Instructions (Signed)
1. Keep splint clean and dry 2. Elevate foot above level of the heart 3. Take aspirin to prevent blood clots 4. Take pain meds as needed 5. Strict non weight bearing to operative extremity  Post Anesthesia Home Care Instructions  Activity: Get plenty of rest for the remainder of the day. A responsible adult should stay with you for 24 hours following the procedure.  For the next 24 hours, DO NOT: -Drive a car -Advertising copywriterperate machinery -Drink alcoholic beverages -Take any medication unless instructed by your physician -Make any legal decisions or sign important papers.  Meals: Start with liquid foods such as gelatin or soup. Progress to regular foods as tolerated. Avoid greasy, spicy, heavy foods. If nausea and/or vomiting occur, drink only clear liquids until the nausea and/or vomiting subsides. Call your physician if vomiting continues.  Special Instructions/Symptoms: Your throat may feel dry or sore from the anesthesia or the breathing tube placed in your throat during surgery. If this causes discomfort, gargle with warm salt water. The discomfort should disappear within 24 hours.   Call your surgeon if you experience:   1.  Fever over 101.0. 2.  Inability to urinate. 3.  Nausea and/or vomiting. 4.  Extreme swelling or bruising at the surgical site. 5.  Continued bleeding from the incision. 6.  Increased pain, redness or drainage from the incision. 7.  Problems related to your pain medication. 8. Any change in color, movement and/or sensation 9. Any problems and/or concerns

## 2014-07-06 NOTE — Transfer of Care (Signed)
Immediate Anesthesia Transfer of Care Note  Patient: Zachary Stephenson  Procedure(s) Performed: Procedure(s): OPEN REDUCTION INTERNAL FIXATION (ORIF) LEFT BIMALLEOLAR ANKLE FRACTURE (Left)  Patient Location: PACU  Anesthesia Type:General  Level of Consciousness: awake, alert  and oriented  Airway & Oxygen Therapy: Patient Spontanous Breathing and Patient connected to face mask oxygen  Post-op Assessment: Report given to PACU RN and Post -op Vital signs reviewed and stable  Post vital signs: Reviewed and stable  Complications: No apparent anesthesia complications

## 2014-07-06 NOTE — Anesthesia Procedure Notes (Addendum)
Anesthesia Regional Block:  Popliteal block  Pre-Anesthetic Checklist: ,, timeout performed, Correct Patient, Correct Site, Correct Laterality, Correct Procedure, Correct Position, site marked, Risks and benefits discussed,  Surgical consent,  Pre-op evaluation,  At surgeon's request and post-op pain management  Laterality: Left  Prep: chloraprep       Needles:  Injection technique: Single-shot  Needle Type: Echogenic Stimulator Needle     Needle Length: 10cm 10 cm Needle Gauge: 21 and 21 G    Additional Needles:  Procedures: ultrasound guided (picture in chart) and nerve stimulator Popliteal block  Nerve Stimulator or Paresthesia:  Response: 0.4 mA,   Additional Responses:   Narrative:  Start time: 07/06/2014 2:10 PM End time: 07/06/2014 2:20 PM Injection made incrementally with aspirations every 5 mL.  Performed by: Personally  Anesthesiologist: Arta BruceSSEY, KEVIN  Additional Notes: Monitors applied. Patient sedated. Sterile prep and drape,hand hygiene and sterile gloves were used. Relevant anatomy identified.Needle position confirmed.Local anesthetic injected incrementally after negative aspiration. Local anesthetic spread visualized around nerve(s). Vascular puncture avoided. No complications. Image printed for medical record.The patient tolerated the procedure well.  Additional Saphenous nerve block performed. 15cc Local Anesthetic mixture placed under ultrasonic guidance along the medio-inferior border of the Sartorious muscle 6 inches above the knee.  No Problems encountered.  Arta BruceKevin Ossey MD    Procedure Name: LMA Insertion Performed by: York GricePEARSON, Tyshae Stair W Pre-anesthesia Checklist: Patient identified, Timeout performed, Emergency Drugs available, Suction available and Patient being monitored Patient Re-evaluated:Patient Re-evaluated prior to inductionOxygen Delivery Method: Circle system utilized Preoxygenation: Pre-oxygenation with 100% oxygen Intubation Type: IV  induction Ventilation: Mask ventilation without difficulty LMA: LMA inserted LMA Size: 5.0 Tube type: Oral Number of attempts: 1 Placement Confirmation: positive ETCO2 Dental Injury: Teeth and Oropharynx as per pre-operative assessment     Performed by: York GricePEARSON, Mirian Casco W

## 2014-07-06 NOTE — Progress Notes (Signed)
Assisted Dr. Ossey with left, ultrasound guided, popliteal/saphenous block. Side rails up, monitors on throughout procedure. See vital signs in flow sheet. Tolerated Procedure well. 

## 2014-07-06 NOTE — Op Note (Signed)
   Date of Surgery: 07/06/2014  INDICATIONS: [name] is a 59 y.o.-year-old male who sustained a left ankle fracture; he was indicated for open reduction and internal fixation due to the displaced nature of the articular fracture and came to the operating room today for this procedure. The patient did consent to the procedure after discussion of the risks and benefits.  PREOPERATIVE DIAGNOSIS: left bimalleolar ankle fracture  POSTOPERATIVE DIAGNOSIS: Same.  PROCEDURE: Open treatment of left ankle fracture with internal fixation. Bimalleolar CPT [id number];   SURGEON: N. Glee ArvinMichael Xu, M.D.  ASSIST: none.  ANESTHESIA:  general, regional  IV FLUIDS AND URINE: See anesthesia.  ESTIMATED BLOOD LOSS: minimal mL.  IMPLANTS: [employer]ker Variax 7 hole 1/3 tubular  COMPLICATIONS: None.  DESCRIPTION OF PROCEDURE: The patient was brought to the operating room and placed supine on the operating table.  The patient had been signed prior to the procedure and this was documented. The patient had the anesthesia placed by the anesthesiologist.  A nonsterile tourniquet was placed on the upper thigh.  The prep verification and incision time-outs were performed to confirm that this was the correct patient, site, side and location. The patient had an SCD on the opposite lower extremity. The patient did receive antibiotics prior to the incision and was re-dosed during the procedure as needed at indicated intervals.  The patient had the lower extremity prepped and draped in the standard surgical fashion.  The extremity was exsanguinated using an esmarch bandage and the tourniquet was inflated to 300 mm Hg.  The bony landmarks were palpated. A lateral incision over the distal fibula was used. Full-thickness flaps were developed. The superficial peroneal nerve was identified and protected during the entire case. The fracture was exposed. The periosteum was elevated off of the fibula. The fracture was exposed. The fracture  was clamped with a tenaculum clamp. Fluoroscopy was used to confirm adequate reduction on orthogonal views. I then placed a 3.5 mm lag screw by technique from anterior to posterior position. The tenaculum clamp was then released and the fracture stayed reduced. We then placed a 7 hole one third tubular plate on the lateral aspect of the fibula. Fluoroscopy was used to judge correct placement. I then placed 2 locking screws distally and 3 nonlocking screws proximally. Final x-rays were taken. The wound was thoroughly irrigated and closed in layer fashion using 2-0 Vicryl and 3-0 nylon. Sterile dressings were applied. The foot was immobilized in a short-leg splint. Patient was x-rayed and transferred to the PACU in stable condition.  POSTOPERATIVE PLAN: [name] will remain nonweightbearing on this leg for approximately 6 weeks; [name] will return for suture removal in 2 weeks.  He will be immobilized in a short leg splint and then transitioned to a short leg cast at his first follow up appointment.  [name] will receive DVT prophylaxis based on other medications, activity level, and risk ratio of bleeding to thrombosis.  Mayra ReelN. Michael Xu, MD [facility] [phone] 4:20 PM

## 2014-07-06 NOTE — Anesthesia Preprocedure Evaluation (Signed)
Anesthesia Evaluation  Patient identified by MRN, date of birth, ID band Patient awake    Reviewed: Allergy & Precautions, NPO status , Patient's Chart, lab work & pertinent test results  Airway Mallampati: II  TM Distance: >3 FB Neck ROM: Full    Dental   Pulmonary sleep apnea ,          Cardiovascular hypertension, Pt. on medications     Neuro/Psych    GI/Hepatic   Endo/Other  diabetes, Type 2, Oral Hypoglycemic Agents  Renal/GU      Musculoskeletal   Abdominal   Peds  Hematology   Anesthesia Other Findings   Reproductive/Obstetrics                             Anesthesia Physical Anesthesia Plan  ASA: III  Anesthesia Plan: General   Post-op Pain Management:    Induction: Intravenous  Airway Management Planned: LMA  Additional Equipment:   Intra-op Plan:   Post-operative Plan: Extubation in OR  Informed Consent: I have reviewed the patients History and Physical, chart, labs and discussed the procedure including the risks, benefits and alternatives for the proposed anesthesia with the patient or authorized representative who has indicated his/her understanding and acceptance.     Plan Discussed with: CRNA and Surgeon  Anesthesia Plan Comments:         Anesthesia Quick Evaluation

## 2014-07-07 ENCOUNTER — Encounter (HOSPITAL_BASED_OUTPATIENT_CLINIC_OR_DEPARTMENT_OTHER): Payer: Self-pay | Admitting: Orthopaedic Surgery

## 2014-07-07 NOTE — Anesthesia Postprocedure Evaluation (Signed)
Anesthesia Post Note  Patient: Zachary Stephenson  Procedure(s) Performed: Procedure(s) (LRB): OPEN REDUCTION INTERNAL FIXATION (ORIF) LEFT BIMALLEOLAR ANKLE FRACTURE (Left)  Anesthesia type: general  Patient location: PACU  Post pain: Pain level controlled  Post assessment: Patient's Cardiovascular Status Stable  Last Vitals:  Filed Vitals:   07/06/14 1745  BP: 138/77  Pulse: 88  Temp: 36.5 C  Resp: 20    Post vital signs: Reviewed and stable  Level of consciousness: sedated  Complications: No apparent anesthesia complications

## 2014-07-22 ENCOUNTER — Other Ambulatory Visit (INDEPENDENT_AMBULATORY_CARE_PROVIDER_SITE_OTHER): Payer: BLUE CROSS/BLUE SHIELD

## 2014-07-22 ENCOUNTER — Ambulatory Visit (INDEPENDENT_AMBULATORY_CARE_PROVIDER_SITE_OTHER): Payer: BLUE CROSS/BLUE SHIELD | Admitting: Internal Medicine

## 2014-07-22 ENCOUNTER — Encounter: Payer: Self-pay | Admitting: Internal Medicine

## 2014-07-22 VITALS — BP 150/84 | HR 82 | Temp 98.2°F | Resp 18 | Ht 71.0 in | Wt 265.1 lb

## 2014-07-22 DIAGNOSIS — E785 Hyperlipidemia, unspecified: Secondary | ICD-10-CM

## 2014-07-22 DIAGNOSIS — R0609 Other forms of dyspnea: Secondary | ICD-10-CM

## 2014-07-22 DIAGNOSIS — I10 Essential (primary) hypertension: Secondary | ICD-10-CM

## 2014-07-22 DIAGNOSIS — E119 Type 2 diabetes mellitus without complications: Secondary | ICD-10-CM

## 2014-07-22 LAB — LIPID PANEL
Cholesterol: 261 mg/dL — ABNORMAL HIGH (ref 0–200)
HDL: 36.7 mg/dL — AB (ref >39.00)
LDL Cholesterol: 189 mg/dL — ABNORMAL HIGH (ref 0–99)
NonHDL: 224.3
Total CHOL/HDL Ratio: 7
Triglycerides: 179 mg/dL — ABNORMAL HIGH (ref 0.0–149.0)
VLDL: 35.8 mg/dL (ref 0.0–40.0)

## 2014-07-22 LAB — COMPREHENSIVE METABOLIC PANEL
ALBUMIN: 4.9 g/dL (ref 3.5–5.2)
ALT: 29 U/L (ref 0–53)
AST: 17 U/L (ref 0–37)
Alkaline Phosphatase: 60 U/L (ref 39–117)
BILIRUBIN TOTAL: 0.6 mg/dL (ref 0.2–1.2)
BUN: 16 mg/dL (ref 6–23)
CHLORIDE: 100 meq/L (ref 96–112)
CO2: 30 mEq/L (ref 19–32)
Calcium: 10.3 mg/dL (ref 8.4–10.5)
Creatinine, Ser: 0.84 mg/dL (ref 0.40–1.50)
GFR: 99.68 mL/min (ref >60.00)
GLUCOSE: 133 mg/dL — AB (ref 70–99)
Potassium: 4.2 mEq/L (ref 3.5–5.1)
Sodium: 137 mEq/L (ref 135–145)
Total Protein: 8 g/dL (ref 6.0–8.3)

## 2014-07-22 LAB — HEMOGLOBIN A1C: Hgb A1c MFr Bld: 6.8 % — ABNORMAL HIGH (ref 4.6–6.5)

## 2014-07-22 MED ORDER — LISINOPRIL-HYDROCHLOROTHIAZIDE 20-25 MG PO TABS: 1.0000 | ORAL_TABLET | Freq: Every day | ORAL | Status: DC

## 2014-07-22 MED ORDER — LISINOPRIL 20 MG PO TABS: 20.0000 mg | ORAL_TABLET | Freq: Every day | ORAL | Status: DC

## 2014-07-22 MED ORDER — AMLODIPINE BESYLATE 10 MG PO TABS: 10.0000 mg | ORAL_TABLET | Freq: Every day | ORAL | Status: DC

## 2014-07-22 NOTE — Progress Notes (Signed)
Pre visit review using our clinic review tool, if applicable. No additional management support is needed unless otherwise documented below in the visit note. 

## 2014-07-22 NOTE — Patient Instructions (Signed)
We will take your blood work today and go over the results when you come back.  We are going to refill your medicines today and send them to your pharmacy.   If you have any problems or questions before you come back just call our office.

## 2014-07-23 ENCOUNTER — Encounter: Payer: Self-pay | Admitting: Internal Medicine

## 2014-07-23 NOTE — Assessment & Plan Note (Signed)
Currently taking amlodipine 10 mg daily and lisinopril 40 mg daily and hctz 25 mg daily. Check BMP and adjust as needed.

## 2014-07-23 NOTE — Assessment & Plan Note (Signed)
Not currently on medication and will check HgA1c.

## 2014-07-23 NOTE — Assessment & Plan Note (Signed)
Currently not taking statin, will check lipid panel.

## 2014-07-23 NOTE — Assessment & Plan Note (Signed)
Denies currently.

## 2014-07-23 NOTE — Progress Notes (Signed)
   Subjective:    Patient ID: Zachary Stephenson, male    DOB: 09-11-1955, 59 y.o.   MRN: [id number]  HPI The patient is here for hypertension. He is out of his medicines and is having high blood pressures at home. He also recently had ankle surgery and this may be affecting his blood pressure. Some dull headaches but nothing that lasts or that he takes medicine for. He denies associated chest pains. He has been out for 2-3 days. He is not having much pain anymore from the ankle.   PMH, [facility], social history, allergies, medication reviewed and updated.  Review of Systems  Constitutional: Positive for activity change. Negative for fever, chills, appetite change and unexpected weight change.       On crutches after ankle surgery  Eyes: Negative.   Respiratory: Negative for cough, chest tightness, shortness of breath and wheezing.   Cardiovascular: Negative for chest pain, palpitations and leg swelling.  Gastrointestinal: Negative for nausea, abdominal pain, diarrhea, constipation, blood in stool and abdominal distention.  Endocrine: Negative.   Musculoskeletal: Positive for arthralgias and gait problem. Negative for myalgias and back pain.  Skin: Negative.   Neurological: Positive for headaches. Negative for dizziness, weakness, light-headedness and numbness.  Psychiatric/Behavioral: Negative.       Objective:   Physical Exam  Constitutional: He is oriented to person, place, and time. He appears well-developed and well-nourished.  HENT:  Head: Normocephalic and atraumatic.  Eyes: EOM are normal.  Neck: Normal range of motion.  Cardiovascular: Normal rate and regular rhythm.   Pulmonary/Chest: Breath sounds normal. No respiratory distress. He has no wheezes. He has no rales.  Abdominal: Soft. Bowel sounds are normal. He exhibits no distension. There is no tenderness. There is no rebound.  Musculoskeletal: He exhibits tenderness. He exhibits no edema.  Neurological: He is alert and oriented  to person, place, and time. Coordination abnormal.  Boot on left ankle and using crutches for ambulation.  Skin: Skin is warm and dry.   Filed Vitals:   07/22/14 0932  BP: 150/84  Pulse: 82  Temp: 98.2 F (36.8 C)  TempSrc: Oral  Resp: 18  Height: 5\' 11"  (1.803 m)  Weight: 265 lb 1.9 oz (120.258 kg)  SpO2: 97%      Assessment & Plan:

## 2014-09-03 ENCOUNTER — Encounter: Payer: Self-pay | Admitting: Internal Medicine

## 2014-09-03 ENCOUNTER — Ambulatory Visit (INDEPENDENT_AMBULATORY_CARE_PROVIDER_SITE_OTHER): Payer: BLUE CROSS/BLUE SHIELD | Admitting: Internal Medicine

## 2014-09-03 VITALS — BP 142/90 | HR 82 | Temp 98.1°F | Resp 12 | Ht 71.0 in | Wt 267.0 lb

## 2014-09-03 DIAGNOSIS — E785 Hyperlipidemia, unspecified: Secondary | ICD-10-CM

## 2014-09-03 DIAGNOSIS — E119 Type 2 diabetes mellitus without complications: Secondary | ICD-10-CM

## 2014-09-03 MED ORDER — ATORVASTATIN CALCIUM 40 MG PO TABS: 40.0000 mg | ORAL_TABLET | Freq: Every day | ORAL | Status: DC

## 2014-09-03 MED ORDER — METFORMIN HCL 500 MG PO TABS: 500.0000 mg | ORAL_TABLET | Freq: Two times a day (BID) | ORAL | Status: DC

## 2014-09-03 NOTE — Progress Notes (Signed)
Pre visit review using our clinic review tool, if applicable. No additional management support is needed unless otherwise documented below in the visit note. 

## 2014-09-03 NOTE — Progress Notes (Signed)
   Subjective:    Patient ID: Zachary Stephenson, male    DOB: 1956-04-18, 89 y.o.   MRN: [id number]  HPI The patient is a 59 YO man who is coming in to discuss lab results indicating high cholesterol and new onset diabetes. He has many questions about what this means and what kinds of foods he needs to avoid.   Review of Systems  Constitutional: Negative.   Respiratory: Negative.   Cardiovascular: Negative.   Gastrointestinal: Negative.   Neurological: Negative.       Objective:   Physical Exam  Constitutional: He appears well-developed and well-nourished.  HENT:  Head: Normocephalic and atraumatic.  Cardiovascular: Normal rate and regular rhythm.   Pulmonary/Chest: Effort normal and breath sounds normal.  Abdominal: Soft.   Filed Vitals:   09/03/14 1451  BP: 142/90  Pulse: 82  Temp: 98.1 F (36.7 C)  TempSrc: Oral  Resp: 12  Height: 5\' 11"  (1.803 m)  Weight: 267 lb (121.11 kg)  SpO2: 93%      Assessment & Plan:  Total visit time 25 minutes, greater than 50% was spent in patient care and coordination with the patient and his wife.

## 2014-09-03 NOTE — Assessment & Plan Note (Signed)
Given HgA1c of 6.8 we decided to start on metformin 500 mg BID for control as well as weight loss. Talked with him and his wife about dietary changes (including brown rice as he eats a lot of white rice). Information provided to him in spanish about the etiology of diabetes and how to take care of himself. Also discussed how exercise helps the body to use sugars better now that his knee is doing better. Answered him and his wife's questions.

## 2014-09-03 NOTE — Assessment & Plan Note (Signed)
Talked to him about his levels and started lipitor 40 mg as his LDL is 189 (should be <100 or ideally <70). Answered questions about how much lifestyle change can help and advised that likely he would need medication and he is able to start that now.

## 2014-09-03 NOTE — Patient Instructions (Signed)
Hemos mandado en medicina para el colesterol llamados atorvastatina que se toma todos [place].  La metformina es un medicamento para la diabetes. Tomar 1 pastilla en la maana de la primera semana. Despus de que tome 1 pastilla por la maana y 1 comprimido por la noche hasta que vuelvas.  Nos gustara que se ve de nuevo en unos 3-6 meses para comprobar los azcares y Print production planner. Si usted tiene algn problema no dude en llamar a la oficina.   We have sent in medicine for the cholesterol called atorvastatin which you take everyday.   The metformin is the medicine for the diabetes. Take 1 pill in the morning the first week. After that take 1 pill in the morning and 1 pill in the evening until you come back.   We would like you see you back in about 3-6 months to check on the sugars and the cholesterol. If you have any problems please feel free to call the office.   Diabetes mellitus tipo2 (Type 2 Diabetes Mellitus) La diabetes mellitus tipo2, generalmente denominada diabetes tipo2, es una enfermedad prolongada (crnica). En la diabetes tipo2, el pncreas no produce suficiente insulina (una hormona), las clulas son menos sensibles a la insulina que se produce (resistencia a la insulina), o ambos. Normalmente, la [employer] azcares de los alimentos a las clulas de los tejidos. Las clulas de los tejidos [employer] azcares para Psychiatrist. La falta de insulina o la falta de una respuesta normal a la insulina hace que el exceso de azcar se acumule en la sangre en lugar de Customer service manager en las clulas de los tejidos. Como resultado, se producen niveles altos de Banker (hiperglucemia). El [address] de los niveles altos de azcar (glucosa) puede causar muchas complicaciones.  La diabetes tipo2 antes tambin se denominaba diabetes del [place], pero puede ocurrir a Actuary.  FACTORES DE RIESGO  Una persona tiene mayor predisposicin a desarrollar diabetes tipo 2 si  alguien en su familia tiene la enfermedad y tambin tiene uno o ms de los siguientes factores de riesgo principales:  Sobrepeso.  Estilo de vida sedentario.  Una historia de consumo constante de alimentos de altas caloras. Mantener un peso saludable y realizar actividad fsica regular puede reducir la probabilidad de desarrollar diabetes tipo 2. SNTOMAS  Una persona con diabetes tipo 2 no presenta sntomas en un principio. Los sntomas de la diabetes tipo 2 aparecen lentamente. Los sntomas son:  Aumento de la sed (polidipsia).  Aumento de la miccin (poliuria).  Orina con ms frecuencia durante la noche (nocturia).  Prdida de peso. La prdida de peso puede ser muy rpida.  Infecciones frecuentes y recurrentes.  Cansancio (fatiga).  Debilidad.  Cambios en la visin, como visin borrosa.  Olor a Water quality scientist.  Dolor abdominal.  Nuseas o vmitos.  Cortes o moretones que tardan en sanarse.  Hormigueo o adormecimiento de las manos y los pies. DIAGNSTICO Con frecuencia la diabetes tipo 2 no se diagnostica hasta que se presentan las complicaciones de la diabetes. La diabetes tipo 2 se diagnostica cuando los sntomas o las complicaciones se presentan y cuando aumentan los niveles de glucosa en la [place]. El nivel de glucosa en la sangre puede controlarse en uno o ms de los siguientes anlisis de sangre:  Medicin de glucosa en la sangre en [place]. No se le permitir comer durante al menos 8 horas antes de que se tome [place] de [place].  Pruebas al azar de  glucosa en la sangre. El nivel de glucosa en la sangre se controla en cualquier momento del da sin importar el momento en que haya comido.  Prueba de A1c (hemoglobina glucosilada) Una prueba de A1c proporciona informacin sobre el control de la glucosa en la sangre durante los ltimos 3 meses.  Prueba de tolerancia a la glucosa oral (PTGO). La glucosa en la sangre se mide despus de no haber comido (ayunas)  durante dos horas y despus de beber una bebida que contenga glucosa. TRATAMIENTO   Usted puede necesitar administrarse insulina o medicamentos para la diabetes todos los das para [employer] niveles de glucosa en la sangre en el rango deseado.  Si [place] insulina, tal vez necesite ajustar la dosis segn los carbohidratos que haya consumido en cada comida o colacin. El objetivo del tratamiento es mantener el nivel de azcar en la sangre previo a comer (glucosa preprandial) entre 70 y /dl. INSTRUCCIONES PARA EL CUIDADO EN EL HOGAR   Controle su nivel de hemoglobina A1c dos veces al ao.  Contrlese a diario Air cabin crew de glucosa en la sangre segn las indicaciones de su mdico.  Supervise las cetonas en la orina cuando est enferma y segn las indicaciones de su mdico.  Tome el medicamento para la diabetes o adminstrese insulina segn las indicaciones de su mdico para Radio producer nivel de glucosa en la sangre en el rango deseado.  Nunca se quede sin medicamento para la diabetes o sin insulina. Es necesario que la reciba [employer].  Si [place] insulina, tal vez deba ajustar la cantidad de insulina administrada segn los carbohidratos consumidos. Los hidratos de carbono pueden aumentar los niveles de glucosa en la sangre, pero deben incluirse en su dieta. Los hidratos de carbono aportan vitaminas, minerales y [place] que son [place] parte esencial de una dieta saludable. Los hidratos de carbono se encuentran en frutas, verduras, cereales integrales, productos lcteos, legumbres y alimentos que contienen azcares aadidos.  Consuma alimentos saludables. Programe una cita con un nutricionista certificado para que lo ayude a Chief Executive Officer de alimentacin adecuado para usted.  Baje de peso si es necesario.  Lleve una tarjeta de alerta mdica o use una pulsera o medalla de alerta mdica.  Lleve con usted una colacin de 15gramos de hidratos de carbono en todo momento para controlar los  niveles bajos de glucosa en la sangre (hipoglucemia). Algunos ejemplos de colaciones de 15gramos de hidratos de carbono son los siguientes:  Tabletas de glucosa, 3 o 4.  Gel de glucosa, tubo de 15 gramos.  Pasas de uva, 2 cucharadas (24 gramos).  Caramelos de goma, 6.  Galletas de [place], 8.  Gaseosa comn, 4onzas ( ).  Pastillas de goma, 9.  Reconocer la hipoglucemia. La hipoglucemia se produce cuando los niveles de glucosa en la sangre son de 70 mg/dl o menos. El riesgo de hipoglucemia aumenta durante el ayuno o cuando se saltea las comidas, durante o despus de Education officer, environmental ejercicio intenso y Tabiona duerme. Los sntomas de hipoglucemia son:  Temblores o sacudidas.  Disminucin de la capacidad de concentracin.  Sudoracin.  Aumento de la frecuencia cardaca.  Dolor de [place].  Sequedad en la boca.  Hambre.  Irritabilidad.  Ansiedad.  Sueo agitado.  Alteracin del habla o de la coordinacin.  Confusin.  Tratar la hipoglucemia rpidamente. Si usted est alerta y puede tragar con seguridad, siga la regla de 15/15 que consiste en:  [employer] 15 y 20gramos de glucosa de accin rpida o carbohidratos. Las opciones  de accin rpida son un gel de glucosa, tabletas de glucosa, o 4 onzas (120 ml) de jugo de frutas, gaseosa comn, o leche baja en grasa.  Compruebe su nivel de glucosa en la sangre 15 minutos despus de tomar la glucosa.  Tome entre 15 y 20 gramos ms de glucosa si el nivel de glucosa en la sangre todava es de /dl o inferior.  Ingiera una comida o una colacin en el lapso de 1 hora una vez que los niveles de glucosa en la sangre vuelven a la normalidad.  Est atento a si siente mucha sed u orina con mayor frecuencia, porque son signos tempranos de hiperglucemia. El reconocimiento temprano de la hiperglucemia permite un tratamiento oportuno. Trate la hiperglucemia segn le indic su mdico.  Haga, al menos, de actividad  fsica moderada a la semana, distribuidos en, por lo menos, 3das a la semana o como lo indique su mdico. Adems, debe realizar ejercicios de resistencia por lo menos 2veces a la semana o como lo indique su mdico. Trate de no permanecer inactivo durante ms de seguidos.  Ajuste su medicamento y la ingesta de alimentos, segn sea necesario, si inicia un nuevo ejercicio o deporte.  Siga su plan para los [address] de enfermedad cuando no puede comer o beber como de [place].  No consuma ningn producto que contenga tabaco, como cigarrillos, tabaco de Theatre manager o Administrator, Civil Service. Si necesita ayuda para dejar de fumar, hable con el mdico.  Limite el consumo de alcohol a no ms de 1 medida por da en las mujeres no embarazadas y 2 medidas en los hombres. Debe beber alcohol solo mientras come. Hable con su mdico acerca de si el alcohol es seguro para usted. Informe a su mdico si bebe alcohol varias veces a la semana.  Concurra a todas las visitas de control como se lo haya indicado el mdico. Esto es importante.  Programe un examen de la vista poco despus del diagnstico de diabetes tipo 2 y luego anualmente.  Realice diariamente el cuidado de la piel y de los pies. Examine su piel y los pies diariamente para ver si tiene cortes, moretones, enrojecimiento, problemas en las uas, sangrado, ampollas o Advertising account planner. Su mdico debe hacerle un examen de los pies una vez por ao.  Cepllese los dientes y encas por lo menos dos veces al da y use hilo dental al menos una vez por da. Concurra regularmente a las visitas de control con el dentista.  Comparta su plan de control de diabetes en el trabajo o en la escuela.  Mantngase al da con las vacunas. Se recomienda que las [employer] de [id number] con diabetes se apliquen la vacuna contra la neumona. En algunos casos, pueden administrarse dos inyecciones separadas. Pregntele al mdico si tiene la vacuna contra la neumona al  da.  Aprenda a Dealer.  Obtenga la mayor cantidad posible de informacin sobre la diabetes y solicite ayuda siempre que sea necesario.  Busque programas de rehabilitacin y participe en ellos para mantener o mejorar su independencia y su calidad de vida. Solicite la derivacin a fisioterapia o terapia ocupacional si se le [facility] o la mano, o tiene problemas para asearse, vestirse, comer, o durante la [place] fsica. SOLICITE ATENCIN MDICA SI:   No puede comer alimentos o beber por ms de 6 horas.  Tuvo nuseas o ha vomitado durante ms de 6 horas.  Su nivel de glucosa en la sangre es mayor de 240 mg/dl.  Presenta  algn cambio en el estado mental.  Desarrolla una enfermedad grave adicional.  Tuvo diarrea durante ms de 6 horas.  Ha estado enfermo o ha tenido fiebre durante un par [address] y no mejora.  Siente dolor al practicar cualquier actividad fsica. SOLICITE ATENCIN MDICA DE INMEDIATO SI:  Tiene dificultad para respirar.  Tiene niveles de cetonas moderados a altos. ASEGRESE DE QUE:  Comprende estas instrucciones.  Controlar su afeccin.  Recibir ayuda de inmediato si no mejora o si empeora. Document Released: 05/29/2005 Document Revised: 10/13/2013 [facility] Patient Information 2015 [employer], [place]. This information is not intended to replace advice given to you by your health care provider. Make sure you discuss any questions you have with your health care provider.

## 2015-03-04 ENCOUNTER — Ambulatory Visit (INDEPENDENT_AMBULATORY_CARE_PROVIDER_SITE_OTHER): Payer: BLUE CROSS/BLUE SHIELD | Admitting: Internal Medicine

## 2015-03-04 ENCOUNTER — Encounter: Payer: Self-pay | Admitting: Internal Medicine

## 2015-03-04 ENCOUNTER — Other Ambulatory Visit (INDEPENDENT_AMBULATORY_CARE_PROVIDER_SITE_OTHER): Payer: BLUE CROSS/BLUE SHIELD

## 2015-03-04 VITALS — BP 142/82 | HR 72 | Temp 97.9°F | Resp 18 | Ht 71.0 in | Wt 263.0 lb

## 2015-03-04 DIAGNOSIS — E119 Type 2 diabetes mellitus without complications: Secondary | ICD-10-CM | POA: Diagnosis not present

## 2015-03-04 DIAGNOSIS — E785 Hyperlipidemia, unspecified: Secondary | ICD-10-CM

## 2015-03-04 DIAGNOSIS — E1169 Type 2 diabetes mellitus with other specified complication: Secondary | ICD-10-CM

## 2015-03-04 DIAGNOSIS — I10 Essential (primary) hypertension: Secondary | ICD-10-CM

## 2015-03-04 DIAGNOSIS — E669 Obesity, unspecified: Secondary | ICD-10-CM | POA: Diagnosis not present

## 2015-03-04 DIAGNOSIS — Z23 Encounter for immunization: Secondary | ICD-10-CM

## 2015-03-04 LAB — COMPREHENSIVE METABOLIC PANEL
ALT: 33 U/L (ref 0–53)
AST: 24 U/L (ref 0–37)
Albumin: 4.7 g/dL (ref 3.5–5.2)
Alkaline Phosphatase: 54 U/L (ref 39–117)
BILIRUBIN TOTAL: 0.7 mg/dL (ref 0.2–1.2)
BUN: 18 mg/dL (ref 6–23)
CALCIUM: 9.9 mg/dL (ref 8.4–10.5)
CHLORIDE: 102 meq/L (ref 96–112)
CO2: 30 meq/L (ref 19–32)
Creatinine, Ser: 0.79 mg/dL (ref 0.40–1.50)
GFR: 106.77 mL/min (ref >60.00)
Glucose, Bld: 115 mg/dL — ABNORMAL HIGH (ref 70–99)
Potassium: 4.4 mEq/L (ref 3.5–5.1)
SODIUM: 139 meq/L (ref 135–145)
Total Protein: 7.4 g/dL (ref 6.0–8.3)

## 2015-03-04 LAB — HEMOGLOBIN A1C: HEMOGLOBIN A1C: 6.5 % (ref 4.6–6.5)

## 2015-03-04 LAB — PSA: PSA: 0.55 ng/mL (ref 0.10–4.00)

## 2015-03-04 MED ORDER — ATORVASTATIN CALCIUM 40 MG PO TABS: 40.0000 mg | ORAL_TABLET | Freq: Every day | ORAL | Status: DC

## 2015-03-04 MED ORDER — LISINOPRIL 20 MG PO TABS: 20.0000 mg | ORAL_TABLET | Freq: Every day | ORAL | Status: DC

## 2015-03-04 MED ORDER — AMLODIPINE BESYLATE 10 MG PO TABS: 10.0000 mg | ORAL_TABLET | Freq: Every day | ORAL | Status: DC

## 2015-03-04 MED ORDER — TERBINAFINE HCL 250 MG PO TABS: 250.0000 mg | ORAL_TABLET | Freq: Every day | ORAL | Status: DC

## 2015-03-04 MED ORDER — METFORMIN HCL 500 MG PO TABS: 500.0000 mg | ORAL_TABLET | Freq: Two times a day (BID) | ORAL | Status: DC

## 2015-03-04 MED ORDER — LISINOPRIL-HYDROCHLOROTHIAZIDE 20-25 MG PO TABS: 1.0000 | ORAL_TABLET | Freq: Every day | ORAL | Status: DC

## 2015-03-04 NOTE — Progress Notes (Signed)
   Subjective:    Patient ID: Zachary Stephenson, male    DOB: July 26, 1955, 59 y.o.   MRN: [id number]  HPI The patient is a 59 YO man coming in for follow up of his blood pressure and diabetes. His diabetes is well controlled and he has started taking metformin since last visit. No side effects from the medication. Trying to exercise some. No new numbness and no numbness or sores on his feet. Still has the toenail fungus and wants to retry the medicine for it.  His blood pressure medicines are the same. He checks at home and usually about 140/70s. No headaches, fevers, chest pains, nausea. No side effects from the medicine and no known complication.   Review of Systems  Constitutional: Negative for fever, chills, activity change, appetite change and unexpected weight change.  HENT: Negative.   Eyes: Negative.   Respiratory: Negative for cough, chest tightness, shortness of breath and wheezing.   Cardiovascular: Negative for chest pain, palpitations and leg swelling.  Gastrointestinal: Negative for nausea, abdominal pain, diarrhea, constipation, blood in stool and abdominal distention.  Musculoskeletal: Negative for myalgias and back pain.  Skin: Negative.   Neurological: Negative for dizziness, weakness, light-headedness and numbness.  Psychiatric/Behavioral: Negative.       Objective:   Physical Exam  Constitutional: He is oriented to person, place, and time. He appears well-developed and well-nourished.  HENT:  Head: Normocephalic and atraumatic.  Eyes: EOM are normal.  Neck: Normal range of motion.  Cardiovascular: Normal rate and regular rhythm.   Pulmonary/Chest: Effort normal and breath sounds normal. No respiratory distress. He has no wheezes. He has no rales.  Abdominal: Soft. Bowel sounds are normal. He exhibits no distension. There is no tenderness. There is no rebound.  Musculoskeletal: He exhibits no edema.  Neurological: He is alert and oriented to person, place, and time.  Coordination normal.  Skin: Skin is warm and dry.  Psychiatric: He has a normal mood and affect.   Filed Vitals:   03/04/15 0935 03/04/15 1004  BP: 154/96 142/82  Pulse: 72   Temp: 97.9 F (36.6 C)   TempSrc: Oral   Resp: 18   Height:  (1.803 m)   Weight: 263 lb (119.296 kg)   SpO2: 98%       Assessment & Plan:  Flu shot given at visit.

## 2015-03-04 NOTE — Progress Notes (Signed)
Pre visit review using our clinic review tool, if applicable. No additional management support is needed unless otherwise documented below in the visit note. 

## 2015-03-04 NOTE — Assessment & Plan Note (Signed)
BP close to goal on HCTZ, lisinopril, amlodipine. Check CMP and adjust as needed.

## 2015-03-04 NOTE — Assessment & Plan Note (Addendum)
Doing well on metformin and checking HgA1c today. Foot exam done today. Talked to him about yearly eye exams. On ACE-I and statin.

## 2015-03-04 NOTE — Assessment & Plan Note (Signed)
Checking lipid panel today since started lipitor after last visit. Last LDL way above goal (<100).

## 2015-03-04 NOTE — Patient Instructions (Signed)
We have sent in the refills and will check the blood today.   We have given you the flu shot today.   Come back in about 3-6 months for a check of the labs.   Diabetes and Standards of Medical Care Diabetes is complicated. You may find that your diabetes team includes a dietitian, nurse, diabetes educator, eye doctor, and more. To help everyone know what is going on and to help you get the care you deserve, the following schedule of care was developed to help keep you on track. Below are the tests, exams, vaccines, medicines, education, and plans you will need. HbA1c test This test shows how well you have controlled your glucose over the past 2-3 months. It is used to see if your diabetes management plan needs to be adjusted.   It is performed at least 2 times a year if you are meeting treatment goals.  It is performed 4 times a year if therapy has changed or if you are not meeting treatment goals. Blood pressure test  This test is performed at every routine medical visit. The goal is less than 140/90 mm Hg for most people, but 130/80 mm Hg in some cases. Ask your health care provider about your goal. Dental exam  Follow up with the dentist regularly. Eye exam  If you are diagnosed with type 1 diabetes as a child, get an exam upon reaching the age of 4 years or older and have had diabetes for 3-5 years. Yearly eye exams are recommended after that initial eye exam.  If you are diagnosed with type 1 diabetes as an adult, get an exam within 5 years of diagnosis and then yearly.  If you are diagnosed with type 2 diabetes, get an exam as soon as possible after the diagnosis and then yearly. Foot care exam  Visual foot exams are performed at every routine medical visit. The exams check for cuts, injuries, or other problems with the feet.  A comprehensive foot exam should be done yearly. This includes visual inspection as well as assessing foot pulses and testing for loss of  sensation.  Check your feet nightly for cuts, injuries, or other problems with your feet. Tell your health care provider if anything is not healing. Kidney function test (urine microalbumin)  This test is performed once a year.  Type 1 diabetes: The first test is performed 5 years after diagnosis.  Type 2 diabetes: The first test is performed at the time of diagnosis.  A serum creatinine and estimated glomerular filtration rate (eGFR) test is done once a year to assess the level of chronic kidney disease (CKD), if present. Lipid profile (cholesterol, HDL, LDL, triglycerides)  Performed every 5 years for most people.  The goal for LDL is less than 100 mg/dL. If you are at high risk, the goal is less than 70 mg/dL.  The goal for HDL is 40 mg/dL-50 mg/dL for men and 50 mg/dL-60 mg/dL for women. An HDL cholesterol of 60 mg/dL or higher gives some protection against heart disease.  The goal for triglycerides is less than 150 mg/dL. Influenza vaccine, pneumococcal vaccine, and hepatitis B vaccine  The influenza vaccine is recommended yearly.  It is recommended that people with diabetes who are over 48 years old get the pneumonia vaccine. In some cases, two separate shots may be given. Ask your health care provider if your pneumonia vaccination is up to date.  The hepatitis B vaccine is also recommended for adults with diabetes. Diabetes  self-management education  Education is recommended at diagnosis and ongoing as needed. Treatment plan  Your treatment plan is reviewed at every medical visit. Document Released: 03/26/2009 Document Revised: 10/13/2013 Document Reviewed: 10/29/2012 [facility] Patient Information 2015 [place], [place]. This information is not intended to replace advice given to you by your health care provider. Make sure you discuss any questions you have with your health care provider.

## 2015-09-01 ENCOUNTER — Ambulatory Visit: Payer: BLUE CROSS/BLUE SHIELD | Admitting: Internal Medicine

## 2015-10-08 ENCOUNTER — Telehealth: Payer: Self-pay

## 2015-10-08 ENCOUNTER — Other Ambulatory Visit: Payer: Self-pay | Admitting: Internal Medicine

## 2015-10-08 NOTE — Telephone Encounter (Signed)
Pt spouse called and requested refills on all medications sent to [facility].   Medications have been sent in today.   LVM for pt to call back as soon as possible.   RE: Are the medications sent to your pharmacy today correct?

## 2015-11-04 ENCOUNTER — Other Ambulatory Visit (INDEPENDENT_AMBULATORY_CARE_PROVIDER_SITE_OTHER): Payer: BLUE CROSS/BLUE SHIELD

## 2015-11-04 ENCOUNTER — Encounter: Payer: Self-pay | Admitting: Internal Medicine

## 2015-11-04 ENCOUNTER — Ambulatory Visit (INDEPENDENT_AMBULATORY_CARE_PROVIDER_SITE_OTHER): Payer: BLUE CROSS/BLUE SHIELD | Admitting: Internal Medicine

## 2015-11-04 VITALS — BP 154/102 | HR 79 | Temp 98.2°F | Resp 18 | Ht 71.0 in | Wt 269.0 lb

## 2015-11-04 DIAGNOSIS — Z1159 Encounter for screening for other viral diseases: Secondary | ICD-10-CM

## 2015-11-04 DIAGNOSIS — E119 Type 2 diabetes mellitus without complications: Secondary | ICD-10-CM | POA: Diagnosis not present

## 2015-11-04 DIAGNOSIS — Z1211 Encounter for screening for malignant neoplasm of colon: Secondary | ICD-10-CM

## 2015-11-04 DIAGNOSIS — I10 Essential (primary) hypertension: Secondary | ICD-10-CM | POA: Diagnosis not present

## 2015-11-04 DIAGNOSIS — Z23 Encounter for immunization: Secondary | ICD-10-CM

## 2015-11-04 DIAGNOSIS — E669 Obesity, unspecified: Secondary | ICD-10-CM

## 2015-11-04 LAB — COMPREHENSIVE METABOLIC PANEL
ALT: 36 U/L (ref 0–53)
AST: 26 U/L (ref 0–37)
Albumin: 5.2 g/dL (ref 3.5–5.2)
Alkaline Phosphatase: 58 U/L (ref 39–117)
BUN: 17 mg/dL (ref 6–23)
CO2: 31 mEq/L (ref 19–32)
Calcium: 10.3 mg/dL (ref 8.4–10.5)
Chloride: 101 mEq/L (ref 96–112)
Creatinine, Ser: 0.82 mg/dL (ref 0.40–1.50)
GFR: 102.04 mL/min (ref >60.00)
Glucose, Bld: 112 mg/dL — ABNORMAL HIGH (ref 70–99)
POTASSIUM: 3.8 meq/L (ref 3.5–5.1)
SODIUM: 138 meq/L (ref 135–145)
TOTAL PROTEIN: 7.6 g/dL (ref 6.0–8.3)
Total Bilirubin: 0.7 mg/dL (ref 0.2–1.2)

## 2015-11-04 LAB — LIPID PANEL
Cholesterol: 142 mg/dL (ref 0–200)
HDL: 31.2 mg/dL — AB (ref >39.00)
LDL CALC: 88 mg/dL (ref 0–99)
NONHDL: 110.36
Total CHOL/HDL Ratio: 5
Triglycerides: 110 mg/dL (ref 0.0–149.0)
VLDL: 22 mg/dL (ref 0.0–40.0)

## 2015-11-04 LAB — HEMOGLOBIN A1C: Hgb A1c MFr Bld: 6.9 % — ABNORMAL HIGH (ref 4.6–6.5)

## 2015-11-04 LAB — PSA: PSA: 0.56 ng/mL (ref 0.10–4.00)

## 2015-11-04 LAB — HEPATITIS C ANTIBODY: HCV Ab: NEGATIVE

## 2015-11-04 NOTE — Assessment & Plan Note (Signed)
Not taking his metformin anymore on his own. Checking HgA1c and if elevated will resume. He was not having side effects and is not sure why he stopped. On ACE-I and statin. Not complicated. Reminded about the need for eye exam as he is not up to date.

## 2015-11-04 NOTE — Assessment & Plan Note (Signed)
Weight is up since last visit and could be related to stopping metformin. Diet is the same (mediocre) and no exercise outside of his work.

## 2015-11-04 NOTE — Progress Notes (Signed)
Pre visit review using our clinic review tool, if applicable. No additional management support is needed unless otherwise documented below in the visit note. 

## 2015-11-04 NOTE — Patient Instructions (Signed)
We will check the labs today and call you back with the results.   You will get a call back about the colon cancer screening.   We have given you the tetanus and pneumonia shot today.   Diabetes and Exercise Exercising regularly is important. It is not just about losing weight. It has many health benefits, such as:  Improving your overall fitness, flexibility, and endurance.  Increasing your bone density.  Helping with weight control.  Decreasing your body fat.  Increasing your muscle strength.  Reducing stress and tension.  Improving your overall health. People with diabetes who exercise gain additional benefits because exercise:  Reduces appetite.  Improves the body's use of blood sugar (glucose).  Helps lower or control blood glucose.  Decreases blood pressure.  Helps control blood lipids (such as cholesterol and triglycerides).  Improves the body's use of the hormone insulin by:  Increasing the body's insulin sensitivity.  Reducing the body's insulin needs.  Decreases the risk for heart disease because exercising:  Lowers cholesterol and triglycerides levels.  Increases the levels of good cholesterol (such as high-density lipoproteins [HDL]) in the body.  Lowers blood glucose levels. YOUR ACTIVITY PLAN  Choose an activity that you enjoy, and set realistic goals. To exercise safely, you should begin practicing any new physical activity slowly, and gradually increase the intensity of the exercise over time. Your health care provider or diabetes educator can help create an activity plan that works for you. General recommendations include:  Encouraging children to engage in at least 60 minutes of physical activity each day.  Stretching and performing strength training exercises, such as yoga or weight lifting, at least 2 times per week.  Performing a total of at least 150 minutes of moderate-intensity exercise each week, such as brisk walking or water  aerobics.  Exercising at least 3 days per week, making sure you allow no more than 2 consecutive days to pass without exercising.  Avoiding long periods of inactivity (90 minutes or more). When you have to spend an extended period of time sitting down, take frequent breaks to walk or stretch. RECOMMENDATIONS FOR EXERCISING WITH TYPE 1 OR TYPE 2 DIABETES   Check your blood glucose before exercising. If blood glucose levels are greater than 240 mg/dL, check for urine ketones. Do not exercise if ketones are present.  Avoid injecting insulin into areas of the body that are going to be exercised. For example, avoid injecting insulin into:  The arms when playing tennis.  The legs when jogging.  Keep a record of:  Food intake before and after you exercise.  Expected peak times of insulin action.  Blood glucose levels before and after you exercise.  The type and amount of exercise you have done.  Review your records with your health care provider. Your health care provider will help you to develop guidelines for adjusting food intake and insulin amounts before and after exercising.  If you take insulin or oral hypoglycemic agents, watch for signs and symptoms of hypoglycemia. They include:  Dizziness.  Shaking.  Sweating.  Chills.  Confusion.  Drink plenty of water while you exercise to prevent dehydration or heat stroke. Body water is lost during exercise and must be replaced.  Talk to your health care provider before starting an exercise program to make sure it is safe for you. Remember, almost any type of activity is better than none.   This information is not intended to replace advice given to you by your health care  provider. Make sure you discuss any questions you have with your health care provider.   Document Released: 08/19/2003 Document Revised: 10/13/2014 Document Reviewed: 11/05/2012 Elsevier Interactive Patient Education [employer].

## 2015-11-04 NOTE — Progress Notes (Signed)
   Subjective:    Patient ID: Zachary Stephenson, male    DOB: 19-Sep-1955, 60 y.o.   MRN: [id number]  HPI The patient is a 60 YO man coming in for follow up on his diabetes (not on meds right now, taking ACE-I and statin, not complicated), his blood pressure (taking htcz, amlodipine and lisinopril without side effects, BP normal at home, no complications), and his obesity (up about 5 pounds since last visit, not exercising, eats a lot of carbs lately). No new complaints or concerns.   Review of Systems  Constitutional: Negative for fever, chills, activity change, appetite change and unexpected weight change.  HENT: Negative.   Eyes: Negative.   Respiratory: Negative for cough, chest tightness, shortness of breath and wheezing.   Cardiovascular: Negative for chest pain, palpitations and leg swelling.  Gastrointestinal: Negative for nausea, abdominal pain, diarrhea, constipation, blood in stool and abdominal distention.  Musculoskeletal: Negative for myalgias and back pain.  Skin: Negative.   Neurological: Negative for dizziness, weakness, light-headedness and numbness.  Psychiatric/Behavioral: Negative.       Objective:   Physical Exam  Constitutional: He is oriented to person, place, and time. He appears well-developed and well-nourished.  HENT:  Head: Normocephalic and atraumatic.  Eyes: EOM are normal.  Neck: Normal range of motion.  Cardiovascular: Normal rate and regular rhythm.   Pulmonary/Chest: Effort normal and breath sounds normal. No respiratory distress. He has no wheezes. He has no rales.  Abdominal: Soft. Bowel sounds are normal. He exhibits no distension. There is no tenderness. There is no rebound.  Musculoskeletal: He exhibits no edema.  Neurological: He is alert and oriented to person, place, and time. Coordination normal.  Skin: Skin is warm and dry.  Psychiatric: He has a normal mood and affect.   Filed Vitals:   11/04/15 1103 11/04/15 1130  BP: 152/94 154/102    Pulse: 79   Temp: 98.2 F (36.8 C)   TempSrc: Oral   Resp: 18   Height: 5\' 11"  (1.803 m)   Weight: 269 lb (122.018 kg)   SpO2: 98%       Assessment & Plan:  Tdap and pneumonia 23 given at visit.

## 2015-11-04 NOTE — Assessment & Plan Note (Signed)
BP slightly above goal on amlodipine 10 mg, lisinopril/hctz 40/25 mg. He states BP is normal at home and does not want to change today. Will monitor as previous BP are better. Talked to him about the fact that his weight gain could have caused the higher BP today. Checking CMP and adjust as needed.

## 2015-11-04 NOTE — Addendum Note (Signed)
Addended by: Conception ChancyOBERSON, Arabelle Bollig R on: 11/04/2015 01:19 PM   Modules accepted: Orders

## 2016-01-25 ENCOUNTER — Encounter (INDEPENDENT_AMBULATORY_CARE_PROVIDER_SITE_OTHER): Payer: Self-pay

## 2016-01-25 ENCOUNTER — Ambulatory Visit (AMBULATORY_SURGERY_CENTER): Payer: Self-pay | Admitting: *Deleted

## 2016-01-25 VITALS — Ht 71.0 in | Wt 268.0 lb

## 2016-01-25 DIAGNOSIS — Z1211 Encounter for screening for malignant neoplasm of colon: Secondary | ICD-10-CM

## 2016-01-25 MED ORDER — NA SULFATE-K SULFATE-MG SULF 17.5-3.13-1.6 GM/177ML PO SOLN: 1.0000 | Freq: Once | ORAL | 0 refills | Status: AC

## 2016-01-25 NOTE — Progress Notes (Signed)
No egg or soy allergy. No anesthesia problems.  No home O2.  No diet meds.  

## 2016-01-26 ENCOUNTER — Encounter: Payer: Self-pay | Admitting: Gastroenterology

## 2016-02-04 ENCOUNTER — Ambulatory Visit (AMBULATORY_SURGERY_CENTER): Payer: BLUE CROSS/BLUE SHIELD | Admitting: Gastroenterology

## 2016-02-04 ENCOUNTER — Encounter: Payer: Self-pay | Admitting: Gastroenterology

## 2016-02-04 VITALS — BP 124/88 | HR 76 | Temp 97.1°F | Resp 17 | Ht 71.0 in | Wt 268.0 lb

## 2016-02-04 DIAGNOSIS — Z1211 Encounter for screening for malignant neoplasm of colon: Secondary | ICD-10-CM | POA: Diagnosis not present

## 2016-02-04 DIAGNOSIS — K621 Rectal polyp: Secondary | ICD-10-CM | POA: Diagnosis not present

## 2016-02-04 MED ORDER — SODIUM CHLORIDE 0.9 % IV SOLN: 500.0000 mL | INTRAVENOUS | Status: DC

## 2016-02-04 NOTE — Progress Notes (Signed)
Report to PACU, RN, vss, BBS= Clear.  

## 2016-02-04 NOTE — Progress Notes (Signed)
Called to room to assist during endoscopic procedure.  Patient ID and intended procedure confirmed with present staff. Received instructions for my participation in the procedure from the performing physician.  

## 2016-02-04 NOTE — Op Note (Signed)
[facility] Patient Name: Zachary Stephenson Procedure Date: 02/04/2016 7:55 AM MRN: [id number] Endoscopist: Sherilyn Cooter L. Myrtie Neither , MD Age: 60 Referring MD:  Date of Birth: 04-27-1956 Gender: Male Account #: [id number] Procedure:                Colonoscopy Indications:              Screening for colorectal malignant neoplasm, This                            is the patient's first colonoscopy Medicines:                Monitored Anesthesia Care Procedure:                Pre-Anesthesia Assessment:                           - Prior to the procedure, a History and Physical                            was performed, and patient medications and                            allergies were reviewed. The patient's tolerance of                            previous anesthesia was also reviewed. The risks                            and benefits of the procedure and the sedation                            options and risks were discussed with the patient.                            All questions were answered, and informed consent                            was obtained. Prior Anticoagulants: The patient has                            taken no previous anticoagulant or antiplatelet                            agents. ASA Grade Assessment: II - A patient with                            mild systemic disease. After reviewing the risks                            and benefits, the patient was deemed in                            satisfactory condition to undergo the procedure.  After obtaining informed consent, the colonoscope                            was passed under direct vision. Throughout the                            procedure, the patient's blood pressure, pulse, and                            oxygen saturations were monitored continuously. The                            Model CF-HQ190L [id number]) scope was introduced                            through the anus and  advanced to the the cecum,                            identified by appendiceal orifice and ileocecal                            valve. The colonoscopy was performed without                            difficulty. The patient tolerated the procedure                            well. The quality of the bowel preparation was                            excellent. The ileocecal valve, appendiceal                            orifice, and rectum were photographed. The quality                            of the bowel preparation was evaluated using the                            BBPS [facility] Bowel Preparation Scale) with scores                            of: Right Colon = 3, Transverse Colon = 3 and Left                            Colon = 3 (entire mucosa seen well with no residual                            staining, small fragments of stool or opaque                            liquid). The total BBPS score equals 9. The bowel  preparation used was SUPREP. Scope In: 8:03:56 AM Scope Out: 8:20:28 AM Scope Withdrawal Time: 0 hours 11 minutes 16 seconds  Total Procedure Duration: 0 hours 16 minutes 32 seconds  Findings:                 The perianal and digital rectal examinations were                            normal.                           A 2 mm polyp was found in the rectum. The polyp was                            sessile. The polyp was removed with a piecemeal                            technique using a cold biopsy forceps. Resection                            and retrieval were complete.                           The exam was otherwise without abnormality on                            direct and retroflexion views. Complications:            No immediate complications. Estimated Blood Loss:     Estimated blood loss: none. Impression:               - One 2 mm polyp in the rectum, removed piecemeal                            using a cold biopsy forceps. Resected and  retrieved.                           - The examination was otherwise normal on direct                            and retroflexion views. Recommendation:           - Patient has a contact number available for                            emergencies. The signs and symptoms of potential                            delayed complications were discussed with the                            patient. Return to normal activities tomorrow.                            Written discharge instructions were provided to the  patient.                           - Resume previous diet.                           - Continue present medications.                           - Await pathology results.                           - Repeat colonoscopy is recommended for                            surveillance. The colonoscopy date will be                            determined after pathology results from today's                            exam become available for review. Henry L. Myrtie Neitheranis, MD 02/04/2016 8:24:24 AM This report has been signed electronically.

## 2016-02-04 NOTE — Patient Instructions (Signed)
YOU HAD AN ENDOSCOPIC PROCEDURE TODAY AT THE [facility]:   Refer to the procedure report that was given to you for any specific questions about what was found during the examination.  If the procedure report does not answer your questions, please call your gastroenterologist to clarify.  If you requested that your care partner not be given the details of your procedure findings, then the procedure report has been included in a sealed envelope for you to review at your convenience later.  YOU SHOULD EXPECT: Some feelings of bloating in the abdomen. Passage of more gas than usual.  Walking can help get rid of the air that was put into your GI tract during the procedure and reduce the bloating. If you had a lower endoscopy (such as a colonoscopy or flexible sigmoidoscopy) you may notice spotting of blood in your stool or on the toilet paper. If you underwent a bowel prep for your procedure, you may not have a normal bowel movement for a few days.  Please Note:  You might notice some irritation and congestion in your nose or some drainage.  This is from the oxygen used during your procedure.  There is no need for concern and it should clear up in a day or so.  SYMPTOMS TO REPORT IMMEDIATELY:   Following lower endoscopy (colonoscopy or flexible sigmoidoscopy):  Excessive amounts of blood in the stool  Significant tenderness or worsening of abdominal pains  Swelling of the abdomen that is new, acute  Fever of 100F or higher   For urgent or emergent issues, a gastroenterologist can be reached at any hour by calling [phone].   DIET:  We do recommend a small meal at first, but then you may proceed to your regular diet.  Drink plenty of fluids but you should avoid alcoholic beverages for 24 hours.  ACTIVITY:  You should plan to take it easy for the rest of today and you should NOT DRIVE or use heavy machinery until tomorrow (because of the sedation medicines used during the test).     FOLLOW UP: Our staff will call the number listed on your records the next business day following your procedure to check on you and address any questions or concerns that you may have regarding the information given to you following your procedure. If we do not reach you, we will leave a message.  However, if you are feeling well and you are not experiencing any problems, there is no need to return our call.  We will assume that you have returned to your regular daily activities without incident.  If any biopsies were taken you will be contacted by phone or by letter within the next 1-3 weeks.  Please call us at [phone] if you have not heard about the biopsies in 3 weeks.    SIGNATURES/CONFIDENTIALITY: You and/or your care partner have signed paperwork which will be entered into your electronic medical record.  These signatures attest to the fact that that the information above on your After Visit Summary has been reviewed and is understood.  Full responsibility of the confidentiality of this discharge information lies with you and/or your care-partner.  Polyps-handout given  Repeat colonoscopy will be determined by pathology.  

## 2016-02-07 ENCOUNTER — Telehealth: Payer: Self-pay

## 2016-02-07 NOTE — Telephone Encounter (Signed)
Left message on machine.

## 2016-02-09 ENCOUNTER — Encounter: Payer: Self-pay | Admitting: Gastroenterology

## 2016-05-12 ENCOUNTER — Other Ambulatory Visit (INDEPENDENT_AMBULATORY_CARE_PROVIDER_SITE_OTHER): Payer: BLUE CROSS/BLUE SHIELD

## 2016-05-12 ENCOUNTER — Encounter: Payer: Self-pay | Admitting: Internal Medicine

## 2016-05-12 ENCOUNTER — Ambulatory Visit (INDEPENDENT_AMBULATORY_CARE_PROVIDER_SITE_OTHER): Payer: BLUE CROSS/BLUE SHIELD | Admitting: Internal Medicine

## 2016-05-12 VITALS — BP 132/82 | HR 80 | Temp 97.4°F | Resp 16 | Ht 73.0 in | Wt 269.0 lb

## 2016-05-12 DIAGNOSIS — E119 Type 2 diabetes mellitus without complications: Secondary | ICD-10-CM

## 2016-05-12 DIAGNOSIS — Z23 Encounter for immunization: Secondary | ICD-10-CM | POA: Diagnosis not present

## 2016-05-12 DIAGNOSIS — R5383 Other fatigue: Secondary | ICD-10-CM

## 2016-05-12 DIAGNOSIS — I10 Essential (primary) hypertension: Secondary | ICD-10-CM | POA: Diagnosis not present

## 2016-05-12 LAB — HEMOGLOBIN A1C: Hgb A1c MFr Bld: 6.5 % (ref 4.6–6.5)

## 2016-05-12 LAB — CBC
HEMATOCRIT: 41 % (ref 39.0–52.0)
Hemoglobin: 13.5 g/dL (ref 13.0–17.0)
MCHC: 32.9 g/dL (ref 30.0–36.0)
MCV: 85.1 fl (ref 78.0–100.0)
PLATELETS: 187 10*3/uL (ref 150.0–400.0)
RBC: 4.82 Mil/uL (ref 4.22–5.81)
RDW: 13.8 % (ref 11.5–15.5)
WBC: 8.1 10*3/uL (ref 4.0–10.5)

## 2016-05-12 LAB — TSH: TSH: 1.25 u[IU]/mL (ref 0.35–4.50)

## 2016-05-12 NOTE — Patient Instructions (Signed)
We have given you the flu shot today.  Think about doing some walking on the days off work even if just for 15-20 minutes.   We are checking the labs and will call back with results.

## 2016-05-12 NOTE — Assessment & Plan Note (Signed)
Checking HgA1c, foot exam done. He is diet controlled and not complicated. On ACE-I and statin.

## 2016-05-12 NOTE — Progress Notes (Signed)
   Subjective:    Patient ID: Zachary Stephenson, male    DOB: [birth year], 60 y.o.   MRN: [id number]  HPI The patient is a 60 YO man coming in for follow up of his blood pressure (taking amlodipine, lisinpril/hctz daily) and his sugars (controlled off meds currently, taking ACE-I and statin, not complicated). Some decrease in his sex drive in the last several months.   Review of Systems  Constitutional: Negative for activity change, appetite change, fever and unexpected weight change.  Respiratory: Negative.   Cardiovascular: Negative.   Gastrointestinal: Negative.   Musculoskeletal: Negative.   Skin: Negative.   Neurological: Negative.       Objective:   Physical Exam  Constitutional: He is oriented to person, place, and time. He appears well-developed and well-nourished.  HENT:  Head: Normocephalic and atraumatic.  Eyes: EOM are normal.  Cardiovascular: Normal rate and regular rhythm.   Pulmonary/Chest: Effort normal and breath sounds normal. No respiratory distress. He has no wheezes. He has no rales.  Abdominal: Soft. He exhibits no distension. There is no tenderness. There is no rebound.  Musculoskeletal: He exhibits no edema.  Neurological: He is alert and oriented to person, place, and time.  Skin: Skin is warm and dry.   Vitals:   05/12/16 0911 05/12/16 0915  BP: (!) 160/98 132/82  Resp: 16   Weight: 269 lb (122 kg)   Height: 6\' 1"  (1.854 m)       Assessment & Plan:  Flu shot given at visit.

## 2016-05-12 NOTE — Assessment & Plan Note (Signed)
BP at goal on amlodipine 10, lisinopril/hctz 40/25.

## 2016-05-12 NOTE — Progress Notes (Signed)
Pre visit review using our clinic review tool, if applicable. No additional management support is needed unless otherwise documented below in the visit note. 

## 2016-05-16 LAB — TESTOSTERONE TOTAL,FREE,BIO, MALES
Albumin: 5 g/dL (ref 3.6–5.1)
SEX HORMONE BINDING: 29 nmol/L (ref 22–77)
TESTOSTERONE FREE: 43.5 pg/mL — AB (ref 46.0–224.0)
TESTOSTERONE: 318 ng/dL (ref 250–827)
Testosterone, Bioavailable: 98.9 ng/dL — ABNORMAL LOW (ref 110.0–575.0)

## 2016-05-22 ENCOUNTER — Telehealth: Payer: Self-pay | Admitting: Internal Medicine

## 2016-05-22 NOTE — Telephone Encounter (Signed)
Received a call from patients wife stating that they never heard from you to advise on lab results. Please give the patient a call today

## 2016-05-23 NOTE — Telephone Encounter (Signed)
Left message for patient to call back  

## 2016-05-24 NOTE — Telephone Encounter (Signed)
Patient called back. I informed him of the lab results.

## 2016-06-14 ENCOUNTER — Other Ambulatory Visit: Payer: Self-pay | Admitting: Internal Medicine

## 2016-08-25 ENCOUNTER — Ambulatory Visit: Payer: BLUE CROSS/BLUE SHIELD
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
## 2016-11-21 ENCOUNTER — Ambulatory Visit: Payer: BLUE CROSS/BLUE SHIELD | Admitting: Internal Medicine

## 2016-11-21 ENCOUNTER — Telehealth: Payer: Self-pay

## 2016-11-21 MED ORDER — LISINOPRIL 20 MG PO TABS: 20.0000 mg | ORAL_TABLET | Freq: Every day | ORAL | 0 refills | Status: DC

## 2016-11-21 MED ORDER — AMLODIPINE BESYLATE 10 MG PO TABS: 10.0000 mg | ORAL_TABLET | Freq: Every day | ORAL | 0 refills | Status: DC

## 2016-11-21 MED ORDER — LISINOPRIL-HYDROCHLOROTHIAZIDE 20-25 MG PO TABS: 1.0000 | ORAL_TABLET | Freq: Every day | ORAL | 0 refills | Status: DC

## 2016-11-21 MED ORDER — ATORVASTATIN CALCIUM 40 MG PO TABS: 40.0000 mg | ORAL_TABLET | Freq: Every day | ORAL | 0 refills | Status: DC

## 2016-11-21 NOTE — Telephone Encounter (Signed)
Med refill

## 2016-12-11 ENCOUNTER — Other Ambulatory Visit: Payer: Self-pay | Admitting: Internal Medicine

## 2017-02-02 ENCOUNTER — Ambulatory Visit: Payer: BLUE CROSS/BLUE SHIELD | Admitting: Nurse Practitioner

## 2017-02-26 ENCOUNTER — Other Ambulatory Visit (INDEPENDENT_AMBULATORY_CARE_PROVIDER_SITE_OTHER): Payer: BLUE CROSS/BLUE SHIELD

## 2017-02-26 ENCOUNTER — Ambulatory Visit (INDEPENDENT_AMBULATORY_CARE_PROVIDER_SITE_OTHER): Payer: BLUE CROSS/BLUE SHIELD | Admitting: Internal Medicine

## 2017-02-26 ENCOUNTER — Encounter: Payer: Self-pay | Admitting: Internal Medicine

## 2017-02-26 VITALS — BP 130/80 | HR 78 | Temp 98.3°F | Ht 73.0 in | Wt 276.0 lb

## 2017-02-26 DIAGNOSIS — I10 Essential (primary) hypertension: Secondary | ICD-10-CM

## 2017-02-26 DIAGNOSIS — E119 Type 2 diabetes mellitus without complications: Secondary | ICD-10-CM

## 2017-02-26 DIAGNOSIS — E785 Hyperlipidemia, unspecified: Secondary | ICD-10-CM

## 2017-02-26 DIAGNOSIS — Z23 Encounter for immunization: Secondary | ICD-10-CM | POA: Diagnosis not present

## 2017-02-26 DIAGNOSIS — N529 Male erectile dysfunction, unspecified: Secondary | ICD-10-CM | POA: Diagnosis not present

## 2017-02-26 DIAGNOSIS — M7022 Olecranon bursitis, left elbow: Secondary | ICD-10-CM

## 2017-02-26 LAB — COMPREHENSIVE METABOLIC PANEL
ALT: 35 U/L (ref 0–53)
AST: 22 U/L (ref 0–37)
Albumin: 4.6 g/dL (ref 3.5–5.2)
Alkaline Phosphatase: 57 U/L (ref 39–117)
BUN: 12 mg/dL (ref 6–23)
CHLORIDE: 104 meq/L (ref 96–112)
CO2: 27 meq/L (ref 19–32)
Calcium: 9.6 mg/dL (ref 8.4–10.5)
Creatinine, Ser: 0.72 mg/dL (ref 0.40–1.50)
GFR: 118.04 mL/min (ref >60.00)
GLUCOSE: 129 mg/dL — AB (ref 70–99)
POTASSIUM: 4 meq/L (ref 3.5–5.1)
SODIUM: 139 meq/L (ref 135–145)
Total Bilirubin: 0.4 mg/dL (ref 0.2–1.2)
Total Protein: 7.2 g/dL (ref 6.0–8.3)

## 2017-02-26 LAB — LIPID PANEL
Cholesterol: 127 mg/dL (ref 0–200)
HDL: 49.4 mg/dL (ref >39.00)
LDL CALC: 61 mg/dL (ref 0–99)
NONHDL: 77.61
Total CHOL/HDL Ratio: 3
Triglycerides: 82 mg/dL (ref 0.0–149.0)
VLDL: 16.4 mg/dL (ref 0.0–40.0)

## 2017-02-26 LAB — CBC
HEMATOCRIT: 41.1 % (ref 39.0–52.0)
HEMOGLOBIN: 13.2 g/dL (ref 13.0–17.0)
MCHC: 32.1 g/dL (ref 30.0–36.0)
MCV: 87.9 fl (ref 78.0–100.0)
Platelets: 201 10*3/uL (ref 150.0–400.0)
RBC: 4.67 Mil/uL (ref 4.22–5.81)
RDW: 14 % (ref 11.5–15.5)
WBC: 7.7 10*3/uL (ref 4.0–10.5)

## 2017-02-26 LAB — PSA: PSA: 0.66 ng/mL (ref 0.10–4.00)

## 2017-02-26 LAB — HEMOGLOBIN A1C: HEMOGLOBIN A1C: 6.6 % — AB (ref 4.6–6.5)

## 2017-02-26 MED ORDER — SILDENAFIL CITRATE 100 MG PO TABS: 50.0000 mg | ORAL_TABLET | Freq: Every day | ORAL | 11 refills | Status: DC | PRN

## 2017-02-26 MED ORDER — AMLODIPINE BESYLATE 10 MG PO TABS: 10.0000 mg | ORAL_TABLET | Freq: Every day | ORAL | 3 refills | Status: DC

## 2017-02-26 MED ORDER — LISINOPRIL-HYDROCHLOROTHIAZIDE 20-25 MG PO TABS: 1.0000 | ORAL_TABLET | Freq: Every day | ORAL | 3 refills | Status: DC

## 2017-02-26 MED ORDER — LISINOPRIL 20 MG PO TABS: 20.0000 mg | ORAL_TABLET | Freq: Every day | ORAL | 3 refills | Status: DC

## 2017-02-26 MED ORDER — ATORVASTATIN CALCIUM 40 MG PO TABS: 40.0000 mg | ORAL_TABLET | Freq: Every day | ORAL | 3 refills | Status: DC

## 2017-02-26 NOTE — Assessment & Plan Note (Signed)
Taking amlodipine, lisinopril and hctz with BP at goal. Checking CMP and adjust as needed.
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
## 2017-02-26 NOTE — Assessment & Plan Note (Signed)
Taking atorvastatin 40 mg daily and checking lipid panel and adjust as needed.

## 2017-02-26 NOTE — Assessment & Plan Note (Signed)
Will get in with sports medicine for drainage. Okay to use otc meds for pain and ice the area. No indication for imaging.

## 2017-02-26 NOTE — Assessment & Plan Note (Signed)
Foot exam done and reminded about eye exam. Not on meds currently. Taking ACE-I and statin. Checking HgA1c and lipid panel and adjust as needed.

## 2017-02-26 NOTE — Assessment & Plan Note (Signed)
Rx for viagra to try for the ED. Advised of precautions and when to seek medical care.

## 2017-02-26 NOTE — Patient Instructions (Signed)
We will check the blood work today and call you back with the results.   We will schedule you with Dr. Jordan Likes to drain the elbow.

## 2017-02-28 ENCOUNTER — Telehealth: Payer: Self-pay | Admitting: Internal Medicine

## 2017-02-28 NOTE — Telephone Encounter (Signed)
Pt's wife returned your call regarding the pt's lab results. I gave her MD response. She expressed understanding and did not have any questions.

## 2017-03-02 ENCOUNTER — Encounter: Payer: Self-pay | Admitting: Family Medicine

## 2017-03-02 ENCOUNTER — Ambulatory Visit (INDEPENDENT_AMBULATORY_CARE_PROVIDER_SITE_OTHER): Payer: BLUE CROSS/BLUE SHIELD | Admitting: Family Medicine

## 2017-03-02 DIAGNOSIS — M7022 Olecranon bursitis, left elbow: Secondary | ICD-10-CM | POA: Diagnosis not present

## 2017-03-02 MED ORDER — CEPHALEXIN 500 MG PO CAPS: 500.0000 mg | ORAL_CAPSULE | Freq: Two times a day (BID) | ORAL | 0 refills | Status: DC

## 2017-03-02 NOTE — Patient Instructions (Signed)
Thank you for coming in,   Please apply ice to the area. Try to keep this wrapped as much as you can.  Please follow-up with me if this comes back.   Please feel free to call with any questions or concerns at any time, at [phone]. --Dr. Jordan Likes
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
## 2017-03-03 NOTE — Assessment & Plan Note (Signed)
Aspiration completed today.  - wrapped in an ACE wrap  - counseled on compression  - keflex was sent for three days.  - advised to follow up if returns.

## 2017-03-08 ENCOUNTER — Ambulatory Visit (INDEPENDENT_AMBULATORY_CARE_PROVIDER_SITE_OTHER): Payer: BLUE CROSS/BLUE SHIELD | Admitting: Family Medicine

## 2017-03-08 VITALS — HR 75 | Temp 97.9°F | Ht 71.0 in | Wt 269.2 lb

## 2017-03-08 DIAGNOSIS — M7022 Olecranon bursitis, left elbow: Secondary | ICD-10-CM

## 2017-03-08 MED ORDER — MELOXICAM 15 MG PO TABS: 15.0000 mg | ORAL_TABLET | Freq: Every day | ORAL | 0 refills | Status: DC

## 2017-03-08 NOTE — Progress Notes (Signed)
Pre visit review using our clinic tool,if applicable. No additional management support is needed unless otherwise documented below in the visit note.  

## 2017-03-08 NOTE — Assessment & Plan Note (Signed)
Has had some fluid recur into the bursa. It is not as much as previously. Having no pain or limitations in range of motion. No signs of infection. 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 Advised to avoid any trauma. - If still occurring 3-4 weeks and repeat aspiration

## 2017-03-08 NOTE — Progress Notes (Signed)
Zachary Stephenson - 61 y.o. male MRN [id number]  Date of birth: [birth year]  SUBJECTIVE:  Including CC & ROS.  Chief Complaint  Patient presents with  . Fluid in Left elbow    Zachary Stephenson is a 33 yo M that is presenting for ongoing olecranon bursitis. He had an aspiration of this on 9/21. He reports the fluid when he returned a few days ago. The course of antibiotics. He denies any pain. He denies any limitation in his movement. Has been trying to wear the Ace wrap. He denies any repeated trauma to the area. He does work and Youth worker.     Review of Systems  Constitutional: Negative for fever.  Musculoskeletal: Negative for gait problem and joint swelling.  Skin: Negative for color change.  Neurological: Negative for weakness.    HISTORY: Past Medical, Surgical, Social, and Family History Reviewed & Updated per EMR.   Pertinent Historical Findings include:  Past Medical History:  Diagnosis Date  . Bimalleolar fracture of left ankle   . Dyspnea on exertion   . HTN (hypertension)   . Hyperlipidemia   . Hypertension   . Sleep apnea     supposed to use CPAP, does not. Will bring DOS  . Type 2 diabetes mellitus (HCC)    no meds at present    Past Surgical History:  Procedure Laterality Date  . ANKLE SURGERY     right ankle,both ankles  . ORIF ANKLE FRACTURE Left 07/06/2014   Procedure: OPEN REDUCTION INTERNAL FIXATION (ORIF) LEFT BIMALLEOLAR ANKLE FRACTURE;  Surgeon: Cheral Almas, MD;  Location: [facility];  Service: Orthopedics;  Laterality: Left;    No Known Allergies  Family History  Problem Relation Age of Onset  . Heart attack Father   . Prostate cancer Brother 57  . Colon cancer Neg Hx      Social History   Social History  . Marital status: Married    Spouse name: N/A  . Number of children: N/A  . Years of education: N/A   Occupational History  . Not on file.   Social History Main Topics  . Smoking status: Never Smoker  .  Smokeless tobacco: Never Used  . Alcohol use Yes     Comment: 3-4 beers weekly  . Drug use: No  . Sexual activity: Not on file   Other Topics Concern  . Not on file   Social History Narrative  . No narrative on file     PHYSICAL EXAM:  VS: Pulse 75   Temp 97.9 F (36.6 C) (Oral)   Ht  (1.803 m)   Wt 269 lb 3.2 oz (122.1 kg)   SpO2 98%   BMI 37.55 kg/m  Physical Exam Gen: NAD, alert, cooperative with exam, well-appearing ENT: normal lips, normal nasal mucosa,  Eye: normal EOM, normal conjunctiva and lids CV:  no edema, +2 pedal pulses   Resp: no accessory muscle use, non-labored,  Skin: no rashes, no areas of induration  Neuro: normal tone, normal sensation to touch Psych:  normal insight, alert and oriented MSK:  Left elbow: Olecranon bursitis present. Normal range of motion. No overlying streaking or redness. Little to no tenderness to palpation. Normal supination and pronation. Normal strength resistance. Neurovascular intact      ASSESSMENT & PLAN:   Olecranon bursitis of left elbow Has had some fluid recur into the bursa. It is not as much as previously. Having no pain or limitations in range of motion. No  signs of infection. 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 Advised to avoid any trauma. - If still occurring 3-4 weeks and repeat aspiration

## 2017-03-08 NOTE — Patient Instructions (Signed)
Thank you for coming in,   Please take the meloxicam for 10 days straight and then as needed after that.   Please keep the elbow wrapped and ice it at night.   Please follow up with me in 3-4 weeks if the bump doesn't go down.    Please feel free to call with any questions or concerns at any time, at [phone]. --Dr. Jordan Likes

## 2017-04-06 ENCOUNTER — Ambulatory Visit (INDEPENDENT_AMBULATORY_CARE_PROVIDER_SITE_OTHER): Payer: BLUE CROSS/BLUE SHIELD | Admitting: Family Medicine

## 2017-04-06 ENCOUNTER — Encounter: Payer: Self-pay | Admitting: Family Medicine

## 2017-04-06 VITALS — BP 120/80 | HR 81 | Temp 97.9°F | Wt 270.0 lb

## 2017-04-06 DIAGNOSIS — M7022 Olecranon bursitis, left elbow: Secondary | ICD-10-CM | POA: Diagnosis not present

## 2017-04-06 NOTE — Progress Notes (Signed)
  Zachary Stephenson - 61 y.o. male MRN [id number]  Date of birth: July 23, 1955  SUBJECTIVE:  Including CC & ROS.  Chief Complaint  Patient presents with  . Follow-up    bursititis of elbow---is much better    [name] is a 61 y.o. male that is following up for his olecranon bursitis. He has had a drainage and had some subsequent swelling. He had been started on meloxicam, ice, and compression. Since that time he noted significant improvement in the swelling of his elbow. He longer has any pain..   Review of Systems  Musculoskeletal: Negative for gait problem and joint swelling.  Skin: Negative for color change.  Neurological: Negative for weakness and numbness.    HISTORY: Past Medical, Surgical, Social, and Family History Reviewed & Updated per EMR.   Pertinent Historical Findings include:  Past Medical History:  Diagnosis Date  . Bimalleolar fracture of left ankle   . Dyspnea on exertion   . HTN (hypertension)   . Hyperlipidemia   . Hypertension   . Sleep apnea     supposed to use CPAP, does not. Will bring DOS  . Type 2 diabetes mellitus (HCC)    no meds at present    Past Surgical History:  Procedure Laterality Date  . ANKLE SURGERY     right ankle,both ankles  . ORIF ANKLE FRACTURE Left 07/06/2014   Procedure: OPEN REDUCTION INTERNAL FIXATION (ORIF) LEFT BIMALLEOLAR ANKLE FRACTURE;  Surgeon: Cheral AlmasNaiping Michael Xu, MD;  Location: [facility];  Service: Orthopedics;  Laterality: Left;    No Known Allergies  Family History  Problem Relation Age of Onset  . Heart attack Father   . Prostate cancer Brother [phone]  . Colon cancer Neg Hx      Social History   Social History  . Marital status: Married    Spouse name: N/A  . Number of children: N/A  . Years of education: N/A   Occupational History  . Not on file.   Social History Main Topics  . Smoking status: Never Smoker  . Smokeless tobacco: Never Used  . Alcohol use Yes     Comment: 3-4 beers  weekly  . Drug use: No  . Sexual activity: Not on file   Other Topics Concern  . Not on file   Social History Narrative  . No narrative on file     PHYSICAL EXAM:  VS: BP 120/80   Pulse 81   Temp 97.9 F (36.6 C)   Wt 270 lb (122.5 kg)   SpO2 98%   BMI 37.66 kg/m  Physical Exam Gen: NAD, alert, cooperative with exam, well-appearing ENT: normal lips, normal nasal mucosa,  Eye: normal EOM, normal conjunctiva and lids CV:  no edema, +2 pedal pulses   Resp: no accessory muscle use, non-labored,  Skin: no rashes, no areas of induration  Neuro: normal tone, normal sensation to touch Psych:  normal insight, alert and oriented MSK:  Left elbow: No significant swelling of the olecranon bursa. No tenderness to palpation. Normal elbow range of motion. Normal grip strength. Neurovascularly intact      ASSESSMENT & PLAN:   Olecranon bursitis of left elbow His symptoms seem to have resolved at this time. - Counseled that he can continue wearing compression and a guarded over this elbow. - If this reoccurs then may need a trial of NSAIDs against versus aspiration - Follow-up as needed

## 2017-04-06 NOTE — Assessment & Plan Note (Signed)
His symptoms seem to have resolved at this time. - Counseled that he can continue wearing compression and a guarded over this elbow. - If this reoccurs then may need a trial of NSAIDs against versus aspiration - Follow-up as needed

## 2017-12-21 ENCOUNTER — Ambulatory Visit: Payer: Self-pay | Admitting: Internal Medicine

## 2017-12-21 ENCOUNTER — Encounter: Payer: Self-pay | Admitting: Internal Medicine

## 2017-12-21 ENCOUNTER — Other Ambulatory Visit (INDEPENDENT_AMBULATORY_CARE_PROVIDER_SITE_OTHER): Payer: Self-pay

## 2017-12-21 VITALS — BP 122/88 | HR 86 | Temp 97.9°F | Ht 71.0 in | Wt 283.0 lb

## 2017-12-21 DIAGNOSIS — I10 Essential (primary) hypertension: Secondary | ICD-10-CM

## 2017-12-21 DIAGNOSIS — N529 Male erectile dysfunction, unspecified: Secondary | ICD-10-CM

## 2017-12-21 DIAGNOSIS — E119 Type 2 diabetes mellitus without complications: Secondary | ICD-10-CM

## 2017-12-21 LAB — COMPREHENSIVE METABOLIC PANEL
ALT: 31 U/L (ref 0–53)
AST: 23 U/L (ref 0–37)
Albumin: 4.8 g/dL (ref 3.5–5.2)
Alkaline Phosphatase: 65 U/L (ref 39–117)
BUN: 16 mg/dL (ref 6–23)
CALCIUM: 9.9 mg/dL (ref 8.4–10.5)
CO2: 29 meq/L (ref 19–32)
CREATININE: 0.86 mg/dL (ref 0.40–1.50)
Chloride: 100 mEq/L (ref 96–112)
GFR: 95.89 mL/min (ref >60.00)
GLUCOSE: 126 mg/dL — AB (ref 70–99)
Potassium: 4.4 mEq/L (ref 3.5–5.1)
SODIUM: 139 meq/L (ref 135–145)
Total Bilirubin: 0.6 mg/dL (ref 0.2–1.2)
Total Protein: 7.5 g/dL (ref 6.0–8.3)

## 2017-12-21 LAB — LIPID PANEL
CHOL/HDL RATIO: 3
Cholesterol: 137 mg/dL (ref 0–200)
HDL: 40.5 mg/dL (ref >39.00)
LDL CALC: 75 mg/dL (ref 0–99)
NONHDL: 96.8
Triglycerides: 107 mg/dL (ref 0.0–149.0)
VLDL: 21.4 mg/dL (ref 0.0–40.0)

## 2017-12-21 LAB — CBC
HCT: 42.4 % (ref 39.0–52.0)
Hemoglobin: 13.8 g/dL (ref 13.0–17.0)
MCHC: 32.6 g/dL (ref 30.0–36.0)
MCV: 86.6 fl (ref 78.0–100.0)
PLATELETS: 203 10*3/uL (ref 150.0–400.0)
RBC: 4.89 Mil/uL (ref 4.22–5.81)
RDW: 14.3 % (ref 11.5–15.5)
WBC: 8.6 10*3/uL (ref 4.0–10.5)

## 2017-12-21 LAB — HEMOGLOBIN A1C: HEMOGLOBIN A1C: 6.9 % — AB (ref 4.6–6.5)

## 2017-12-21 LAB — PSA: PSA: 0.81 ng/mL (ref 0.10–4.00)

## 2017-12-21 MED ORDER — LISINOPRIL-HYDROCHLOROTHIAZIDE 20-25 MG PO TABS: 1.0000 | ORAL_TABLET | Freq: Every day | ORAL | 3 refills | Status: DC

## 2017-12-21 MED ORDER — TADALAFIL 20 MG PO TABS: 20.0000 mg | ORAL_TABLET | Freq: Every day | ORAL | 11 refills | Status: DC | PRN

## 2017-12-21 MED ORDER — AMLODIPINE BESYLATE 10 MG PO TABS: 10.0000 mg | ORAL_TABLET | Freq: Every day | ORAL | 3 refills | Status: DC

## 2017-12-21 MED ORDER — MELOXICAM 15 MG PO TABS: 15.0000 mg | ORAL_TABLET | Freq: Every day | ORAL | 3 refills | Status: DC

## 2017-12-21 MED ORDER — LISINOPRIL 20 MG PO TABS: 20.0000 mg | ORAL_TABLET | Freq: Every day | ORAL | 3 refills | Status: DC

## 2017-12-21 MED ORDER — ATORVASTATIN CALCIUM 40 MG PO TABS: 40.0000 mg | ORAL_TABLET | Freq: Every day | ORAL | 3 refills | Status: DC

## 2017-12-21 NOTE — Assessment & Plan Note (Signed)
BP at goal on lisinopril/hctz and amlodipine max dose. Checking CMP.

## 2017-12-21 NOTE — Progress Notes (Signed)
   Subjective:    Patient ID: Zachary Stephenson, male    DOB: 06/14/1955, 62 y.o.   MRN: [id number]  HPI The patient is a 62 YO man coming in for follow up of his blood pressure (taking lisinopril/hctz and amlodipine, denies headaches or migraines) and right knee pain (taking tylenol for pain which is not working well, prior injection in the knee with orthopedic and were told there that this was arthritis).   Review of Systems  Constitutional: Negative.   HENT: Negative.   Eyes: Negative.   Respiratory: Negative for cough, chest tightness and shortness of breath.   Cardiovascular: Negative for chest pain, palpitations and leg swelling.  Gastrointestinal: Negative for abdominal distention, abdominal pain, constipation, diarrhea, nausea and vomiting.  Musculoskeletal: Positive for arthralgias.  Skin: Negative.   Neurological: Negative.   Psychiatric/Behavioral: Negative.       Objective:   Physical Exam  Constitutional: He is oriented to person, place, and time. He appears well-developed and well-nourished.  HENT:  Head: Normocephalic and atraumatic.  Eyes: EOM are normal.  Neck: Normal range of motion.  Cardiovascular: Normal rate and regular rhythm.  Pulmonary/Chest: Effort normal and breath sounds normal. No respiratory distress. He has no wheezes. He has no rales.  Abdominal: Soft. Bowel sounds are normal. He exhibits no distension. There is no tenderness. There is no rebound.  Musculoskeletal: He exhibits tenderness. He exhibits no edema.  Neurological: He is alert and oriented to person, place, and time. Coordination normal.  Skin: Skin is warm and dry.  Psychiatric: He has a normal mood and affect.   Vitals:   12/21/17 0805  BP: 122/88  Pulse: 86  Temp: 97.9 F (36.6 C)  TempSrc: Oral  SpO2: 95%  Weight: 283 lb (128.4 kg)  Height: 5\' 11"  (1.803 m)      Assessment & Plan:

## 2017-12-21 NOTE — Patient Instructions (Signed)
We will send in cialis instead of the viagra to see if this works better.   We have sent in meloxicam (mobic) to take 1 pill daily if needed for pain in the knee.

## 2017-12-21 NOTE — Assessment & Plan Note (Signed)
Change viagra to cialis for ineffective.

## 2017-12-21 NOTE — Assessment & Plan Note (Signed)
Checking HgA1c, diet controlled. Adjust as needed.

## 2017-12-24 ENCOUNTER — Telehealth: Payer: Self-pay | Admitting: Internal Medicine

## 2017-12-24 NOTE — Telephone Encounter (Signed)
Copied from CRM [id number]. Topic: Inquiry >> Dec 24, 2017 11:05 AM Yvonna Alanisobinson, Andra M wrote: Reason for CRM: Patient's wife called requesting a refill for baclofen (LIORESAL) 10 MG tablet. [facility] [place] - [address] [phone] (Phone) [phone] (Fax).       Thank You!!!

## 2017-12-24 NOTE — Telephone Encounter (Signed)
Requesting refill of Baclofen by Historical Provider  LOV 12/21/17  Dr. Donetta Pottsrawford    [facility] [place] - [address]   [phone] (Phone) [phone] (Fax).

## 2017-12-25 MED ORDER — BACLOFEN 10 MG PO TABS: 10.0000 mg | ORAL_TABLET | Freq: Three times a day (TID) | ORAL | 0 refills | Status: DC

## 2017-12-25 NOTE — Telephone Encounter (Signed)
MD approved and sent to [email or web address] 

## 2018-12-09 ENCOUNTER — Other Ambulatory Visit: Payer: Self-pay | Admitting: Internal Medicine

## 2019-03-10 ENCOUNTER — Other Ambulatory Visit: Payer: Self-pay | Admitting: Internal Medicine

## 2019-03-14 ENCOUNTER — Other Ambulatory Visit: Payer: Self-pay

## 2019-03-14 ENCOUNTER — Ambulatory Visit (INDEPENDENT_AMBULATORY_CARE_PROVIDER_SITE_OTHER): Payer: Self-pay | Admitting: Internal Medicine

## 2019-03-14 ENCOUNTER — Encounter: Payer: Self-pay | Admitting: Internal Medicine

## 2019-03-14 ENCOUNTER — Other Ambulatory Visit (INDEPENDENT_AMBULATORY_CARE_PROVIDER_SITE_OTHER): Payer: Self-pay

## 2019-03-14 VITALS — BP 150/82 | HR 81 | Temp 98.1°F | Ht 71.0 in | Wt 277.0 lb

## 2019-03-14 DIAGNOSIS — Z23 Encounter for immunization: Secondary | ICD-10-CM

## 2019-03-14 DIAGNOSIS — E785 Hyperlipidemia, unspecified: Secondary | ICD-10-CM

## 2019-03-14 DIAGNOSIS — E119 Type 2 diabetes mellitus without complications: Secondary | ICD-10-CM

## 2019-03-14 DIAGNOSIS — E1169 Type 2 diabetes mellitus with other specified complication: Secondary | ICD-10-CM

## 2019-03-14 DIAGNOSIS — I10 Essential (primary) hypertension: Secondary | ICD-10-CM

## 2019-03-14 LAB — CBC
HCT: 42.3 % (ref 39.0–52.0)
Hemoglobin: 13.8 g/dL (ref 13.0–17.0)
MCHC: 32.7 g/dL (ref 30.0–36.0)
MCV: 87.2 fl (ref 78.0–100.0)
Platelets: 203 10*3/uL (ref 150.0–400.0)
RBC: 4.85 Mil/uL (ref 4.22–5.81)
RDW: 14.1 % (ref 11.5–15.5)
WBC: 8.4 10*3/uL (ref 4.0–10.5)

## 2019-03-14 LAB — COMPREHENSIVE METABOLIC PANEL
ALT: 30 U/L (ref 0–53)
AST: 22 U/L (ref 0–37)
Albumin: 4.8 g/dL (ref 3.5–5.2)
Alkaline Phosphatase: 56 U/L (ref 39–117)
BUN: 17 mg/dL (ref 6–23)
CO2: 31 mEq/L (ref 19–32)
Calcium: 9.9 mg/dL (ref 8.4–10.5)
Chloride: 98 mEq/L (ref 96–112)
Creatinine, Ser: 0.78 mg/dL (ref 0.40–1.50)
GFR: 100.58 mL/min (ref >60.00)
Glucose, Bld: 102 mg/dL — ABNORMAL HIGH (ref 70–99)
Potassium: 3.6 mEq/L (ref 3.5–5.1)
Sodium: 138 mEq/L (ref 135–145)
Total Bilirubin: 0.8 mg/dL (ref 0.2–1.2)
Total Protein: 7.8 g/dL (ref 6.0–8.3)

## 2019-03-14 LAB — LIPID PANEL
Cholesterol: 223 mg/dL — ABNORMAL HIGH (ref 0–200)
HDL: 39.8 mg/dL (ref >39.00)
LDL Cholesterol: 150 mg/dL — ABNORMAL HIGH (ref 0–99)
NonHDL: 182.86
Total CHOL/HDL Ratio: 6
Triglycerides: 165 mg/dL — ABNORMAL HIGH (ref 0.0–149.0)
VLDL: 33 mg/dL (ref 0.0–40.0)

## 2019-03-14 LAB — MICROALBUMIN / CREATININE URINE RATIO
Creatinine,U: 121 mg/dL
Microalb Creat Ratio: 2.5 mg/g (ref 0.0–30.0)
Microalb, Ur: 3 mg/dL — ABNORMAL HIGH (ref 0.0–1.9)

## 2019-03-14 LAB — HEMOGLOBIN A1C: Hgb A1c MFr Bld: 6.9 % — ABNORMAL HIGH (ref 4.6–6.5)

## 2019-03-14 MED ORDER — LISINOPRIL 20 MG PO TABS: 20.0000 mg | ORAL_TABLET | Freq: Every day | ORAL | 3 refills | Status: DC

## 2019-03-14 MED ORDER — AMLODIPINE BESYLATE 10 MG PO TABS: 10.0000 mg | ORAL_TABLET | Freq: Every day | ORAL | 3 refills | Status: DC

## 2019-03-14 MED ORDER — ATORVASTATIN CALCIUM 40 MG PO TABS: 40.0000 mg | ORAL_TABLET | Freq: Every day | ORAL | 3 refills | Status: DC

## 2019-03-14 MED ORDER — MELOXICAM 15 MG PO TABS: 15.0000 mg | ORAL_TABLET | Freq: Every day | ORAL | 1 refills | Status: DC

## 2019-03-14 MED ORDER — LISINOPRIL-HYDROCHLOROTHIAZIDE 20-25 MG PO TABS: 1.0000 | ORAL_TABLET | Freq: Every day | ORAL | 3 refills | Status: DC

## 2019-03-14 NOTE — Progress Notes (Signed)
   Subjective:   Patient ID: Zachary Stephenson, male    DOB: 1955/06/16, 63 y.o.   MRN: [id number]  HPI The patient is a 63 YO man coming in for follow up medical conditions of diabetes. Denies new concerns except knees and seeing orthopedics for that. Does not having tingling of numbness of feet. Denies diet changes. Weight fairly stable.   Review of Systems  Constitutional: Negative.   HENT: Negative.   Eyes: Negative.   Respiratory: Negative for cough, chest tightness and shortness of breath.   Cardiovascular: Negative for chest pain, palpitations and leg swelling.  Gastrointestinal: Negative for abdominal distention, abdominal pain, constipation, diarrhea, nausea and vomiting.  Musculoskeletal: Positive for arthralgias.  Skin: Negative.   Neurological: Negative.   Psychiatric/Behavioral: Negative.     Objective:  Physical Exam Constitutional:      Appearance: He is well-developed. He is obese.  HENT:     Head: Normocephalic and atraumatic.  Neck:     Musculoskeletal: Normal range of motion.  Cardiovascular:     Rate and Rhythm: Normal rate and regular rhythm.  Pulmonary:     Effort: Pulmonary effort is normal. No respiratory distress.     Breath sounds: Normal breath sounds. No wheezing or rales.  Abdominal:     General: Bowel sounds are normal. There is no distension.     Palpations: Abdomen is soft.     Tenderness: There is no abdominal tenderness. There is no rebound.  Skin:    General: Skin is warm and dry.  Neurological:     Mental Status: He is alert and oriented to person, place, and time.     Coordination: Coordination normal.     Vitals:   03/14/19 1335  BP: (!) 150/82  Pulse: 81  Temp: 98.1 F (36.7 C)  TempSrc: Oral  SpO2: 98%  Weight: 277 lb (125.6 kg)  Height: 5\' 11"  (1.803 m)    Assessment & Plan:  Flu shot given at visit

## 2019-03-14 NOTE — Assessment & Plan Note (Signed)
Taking lipitor 40 mg daily, checking lipid panel and adjust as needed.  

## 2019-03-14 NOTE — Assessment & Plan Note (Signed)
Foot exam done, checking HgA1c, lipid panel and microalbumin to creatinine ratio. On statin and ACE-I. Diet controlled at this time. Adjust as needed.

## 2019-03-14 NOTE — Assessment & Plan Note (Signed)
Refilled lisinopril/hctz and amlodipine. Has not taken meds today. Checking CMP and adjust as needed.

## 2019-03-14 NOTE — Patient Instructions (Signed)

## 2019-03-17 ENCOUNTER — Other Ambulatory Visit: Payer: Self-pay | Admitting: Internal Medicine

## 2019-03-17 ENCOUNTER — Telehealth: Payer: Self-pay

## 2019-03-17 NOTE — Telephone Encounter (Signed)
Received [facility] patient states did not receive atorvastatin or baclofen. Looks like atorvastatin was sent in but baclofen was taken off the med list

## 2019-03-18 ENCOUNTER — Ambulatory Visit: Payer: Self-pay

## 2019-03-18 NOTE — Telephone Encounter (Signed)
Done separately

## 2019-03-18 NOTE — Telephone Encounter (Signed)
Incoming call from Patient wife  Requesting lab results. Provided lab result of Dr. Sharlet Salina.  03/18/1999

## 2019-12-08 ENCOUNTER — Other Ambulatory Visit: Payer: Self-pay | Admitting: Internal Medicine

## 2020-01-15 ENCOUNTER — Ambulatory Visit (INDEPENDENT_AMBULATORY_CARE_PROVIDER_SITE_OTHER): Payer: Self-pay

## 2020-01-15 ENCOUNTER — Ambulatory Visit (INDEPENDENT_AMBULATORY_CARE_PROVIDER_SITE_OTHER): Payer: Self-pay | Admitting: Internal Medicine

## 2020-01-15 ENCOUNTER — Encounter: Payer: Self-pay | Admitting: Internal Medicine

## 2020-01-15 ENCOUNTER — Other Ambulatory Visit: Payer: Self-pay

## 2020-01-15 VITALS — BP 124/82 | HR 83 | Temp 98.5°F | Ht 71.0 in | Wt 275.0 lb

## 2020-01-15 DIAGNOSIS — M25572 Pain in left ankle and joints of left foot: Secondary | ICD-10-CM

## 2020-01-15 DIAGNOSIS — E119 Type 2 diabetes mellitus without complications: Secondary | ICD-10-CM

## 2020-01-15 DIAGNOSIS — N529 Male erectile dysfunction, unspecified: Secondary | ICD-10-CM

## 2020-01-15 MED ORDER — LISINOPRIL-HYDROCHLOROTHIAZIDE 20-25 MG PO TABS: 1.0000 | ORAL_TABLET | Freq: Every day | ORAL | 3 refills | Status: DC

## 2020-01-15 MED ORDER — AMLODIPINE BESYLATE 10 MG PO TABS: 10.0000 mg | ORAL_TABLET | Freq: Every day | ORAL | 3 refills | Status: DC

## 2020-01-15 MED ORDER — TADALAFIL 20 MG PO TABS: ORAL_TABLET | ORAL | 11 refills | Status: DC

## 2020-01-15 MED ORDER — LISINOPRIL 20 MG PO TABS: 20.0000 mg | ORAL_TABLET | Freq: Every day | ORAL | 3 refills | Status: DC

## 2020-01-15 MED ORDER — BACLOFEN 10 MG PO TABS: 10.0000 mg | ORAL_TABLET | Freq: Three times a day (TID) | ORAL | 11 refills | Status: DC

## 2020-01-15 MED ORDER — MELOXICAM 15 MG PO TABS: 15.0000 mg | ORAL_TABLET | Freq: Every day | ORAL | 3 refills | Status: DC

## 2020-01-15 MED ORDER — LIDOCAINE 5 % EX PTCH: 1.0000 | MEDICATED_PATCH | CUTANEOUS | 3 refills | Status: DC

## 2020-01-15 MED ORDER — TRIAMCINOLONE ACETONIDE 0.1 % EX CREA
1.0000 | TOPICAL_CREAM | Freq: Two times a day (BID) | CUTANEOUS | 0 refills | Status: DC
Start: 2020-01-15 — End: 2024-05-05

## 2020-01-15 MED ORDER — ATORVASTATIN CALCIUM 40 MG PO TABS: 40.0000 mg | ORAL_TABLET | Freq: Every day | ORAL | 3 refills | Status: DC

## 2020-01-15 NOTE — Progress Notes (Signed)
° °  Subjective:   Patient ID: Zachary Stephenson, male    DOB: August 05, 1955, 64 y.o.   MRN: [id number]  HPI The patient is a 64 YO man coming in for left ankle pain. Prior fracture and surgery in 2016. Pain started in the past few months and hurts. This is limiting him from doing his job. Hurts with walking. Has tried tylenol and ibuprofen without relief. Some swelling. Denies injury or fall. Overall worsening.  Review of Systems  Constitutional: Negative.   HENT: Negative.   Eyes: Negative.   Respiratory: Negative for cough, chest tightness and shortness of breath.   Cardiovascular: Negative for chest pain, palpitations and leg swelling.  Gastrointestinal: Negative for abdominal distention, abdominal pain, constipation, diarrhea, nausea and vomiting.  Musculoskeletal: Positive for arthralgias and myalgias.  Skin: Negative.   Neurological: Negative.   Psychiatric/Behavioral: Negative.     Objective:  Physical Exam Constitutional:      Appearance: He is well-developed.  HENT:     Head: Normocephalic and atraumatic.  Cardiovascular:     Rate and Rhythm: Normal rate and regular rhythm.  Pulmonary:     Effort: Pulmonary effort is normal. No respiratory distress.     Breath sounds: Normal breath sounds. No wheezing or rales.  Abdominal:     General: Bowel sounds are normal. There is no distension.     Palpations: Abdomen is soft.     Tenderness: There is no abdominal tenderness. There is no rebound.  Musculoskeletal:        General: Tenderness present.     Cervical back: Normal range of motion.     Comments: Tenderness left achilles region  Skin:    General: Skin is warm and dry.  Neurological:     Mental Status: He is alert and oriented to person, place, and time.     Coordination: Coordination normal.     Vitals:   01/15/20 1406  BP: 124/82  Pulse: 83  Temp: 98.5 F (36.9 C)  TempSrc: Oral  SpO2: 97%  Weight: 275 lb (124.7 kg)  Height: 5\' 11"  (1.803 m)    This visit  occurred during the SARS-CoV-2 public health emergency.  Safety protocols were in place, including screening questions prior to the visit, additional usage of staff PPE, and extensive cleaning of exam room while observing appropriate contact time as indicated for disinfecting solutions.   Assessment & Plan:

## 2020-01-15 NOTE — Patient Instructions (Signed)
We will take an x-ray of the ankle and the blood work.

## 2020-01-16 DIAGNOSIS — M25572 Pain in left ankle and joints of left foot: Secondary | ICD-10-CM | POA: Insufficient documentation

## 2020-01-16 LAB — LIPID PANEL
Cholesterol: 129 mg/dL (ref <200)
HDL: 38 mg/dL — ABNORMAL LOW (ref >=40)
LDL Cholesterol (Calc): 72 mg/dL (calc)
Non-HDL Cholesterol (Calc): 91 mg/dL (calc) (ref <130)
Total CHOL/HDL Ratio: 3.4 (calc) (ref <5.0)
Triglycerides: 108 mg/dL (ref <150)

## 2020-01-16 LAB — COMPREHENSIVE METABOLIC PANEL
AG Ratio: 2 (calc) (ref 1.0–2.5)
ALT: 28 U/L (ref 9–46)
AST: 23 U/L (ref 10–35)
Albumin: 4.9 g/dL (ref 3.6–5.1)
Alkaline phosphatase (APISO): 55 U/L (ref 35–144)
BUN: 21 mg/dL (ref 7–25)
CO2: 30 mmol/L (ref 20–32)
Calcium: 9.8 mg/dL (ref 8.6–10.3)
Chloride: 100 mmol/L (ref 98–110)
Creat: 0.91 mg/dL (ref 0.70–1.25)
Globulin: 2.5 g/dL (calc) (ref 1.9–3.7)
Glucose, Bld: 95 mg/dL (ref 65–99)
Potassium: 4.2 mmol/L (ref 3.5–5.3)
Sodium: 139 mmol/L (ref 135–146)
Total Bilirubin: 0.9 mg/dL (ref 0.2–1.2)
Total Protein: 7.4 g/dL (ref 6.1–8.1)

## 2020-01-16 LAB — HEMOGLOBIN A1C
Hgb A1c MFr Bld: 6.8 % of total Hgb — ABNORMAL HIGH (ref <5.7)
Mean Plasma Glucose: 148 (calc)
eAG (mmol/L): 8.2 (calc)

## 2020-01-16 LAB — CBC
HCT: 40.5 % (ref 38.5–50.0)
Hemoglobin: 13.2 g/dL (ref 13.2–17.1)
MCH: 28.2 pg (ref 27.0–33.0)
MCHC: 32.6 g/dL (ref 32.0–36.0)
MCV: 86.5 fL (ref 80.0–100.0)
MPV: 12.7 fL — ABNORMAL HIGH (ref 7.5–12.5)
Platelets: 215 10*3/uL (ref 140–400)
RBC: 4.68 10*6/uL (ref 4.20–5.80)
RDW: 12.8 % (ref 11.0–15.0)
WBC: 8.3 10*3/uL (ref 3.8–10.8)

## 2020-01-16 LAB — PSA: PSA: 0.8 ng/mL (ref <=4.0)

## 2020-01-16 NOTE — Assessment & Plan Note (Signed)
Checking labs as they are due and foot exam done.

## 2020-01-16 NOTE — Assessment & Plan Note (Signed)
Concern for hardware loosening causing pain. Checking left ankle x-ray. Treat as appropriate. Rx lidoderm patches to help with pain.

## 2020-01-22 ENCOUNTER — Other Ambulatory Visit: Payer: Self-pay | Admitting: Internal Medicine

## 2020-01-22 MED ORDER — LIDOCAINE 5 % EX PTCH: 1.0000 | MEDICATED_PATCH | CUTANEOUS | 11 refills | Status: DC

## 2020-02-20 ENCOUNTER — Other Ambulatory Visit: Payer: Self-pay

## 2020-02-20 DIAGNOSIS — Z20822 Contact with and (suspected) exposure to covid-19: Secondary | ICD-10-CM

## 2020-02-23 ENCOUNTER — Encounter: Payer: Self-pay | Admitting: Internal Medicine

## 2020-02-23 ENCOUNTER — Telehealth: Payer: Self-pay | Admitting: *Deleted

## 2020-02-23 LAB — NOVEL CORONAVIRUS, NAA: SARS-CoV-2, NAA: NOT DETECTED

## 2020-02-23 NOTE — Telephone Encounter (Signed)
Patient's spouse, Sheran Lawless, called to check on patient's COVID-19 test results.  This RN informed spouse that the test is still in progress.  Advised that results are taking 48-72 business hours at this time due to volume of testing.  No further questions from spouse at this time.

## 2020-11-01 ENCOUNTER — Other Ambulatory Visit (HOSPITAL_COMMUNITY): Payer: Self-pay

## 2020-11-01 ENCOUNTER — Telehealth (INDEPENDENT_AMBULATORY_CARE_PROVIDER_SITE_OTHER): Payer: Self-pay | Admitting: Internal Medicine

## 2020-11-01 ENCOUNTER — Other Ambulatory Visit: Payer: Self-pay

## 2020-11-01 ENCOUNTER — Telehealth: Payer: Self-pay | Admitting: Internal Medicine

## 2020-11-01 ENCOUNTER — Encounter: Payer: Self-pay | Admitting: Internal Medicine

## 2020-11-01 DIAGNOSIS — U071 COVID-19: Secondary | ICD-10-CM | POA: Insufficient documentation

## 2020-11-01 MED ORDER — PROMETHAZINE-DM 6.25-15 MG/5ML PO SYRP
5.0000 mL | ORAL_SOLUTION | Freq: Four times a day (QID) | ORAL | 0 refills | Status: DC | PRN
  Filled 2020-11-01: qty 118, 6d supply, fill #0

## 2020-11-01 MED ORDER — PROMETHAZINE-DM 6.25-15 MG/5ML PO SYRP
5.0000 mL | ORAL_SOLUTION | Freq: Four times a day (QID) | ORAL | 0 refills | Status: DC | PRN
Start: 2020-11-01 — End: 2020-11-01

## 2020-11-01 MED ORDER — MOLNUPIRAVIR EUA 200MG CAPSULE: 4.0000 | ORAL_CAPSULE | Freq: Two times a day (BID) | ORAL | 0 refills | Status: DC

## 2020-11-01 MED ORDER — MOLNUPIRAVIR EUA 200MG CAPSULE
4.0000 | ORAL_CAPSULE | Freq: Two times a day (BID) | ORAL | 0 refills | Status: AC
  Filled 2020-11-01: qty 40, 5d supply, fill #0

## 2020-11-01 NOTE — Progress Notes (Signed)
Virtual Visit via Video Note  I connected with Zachary Stephenson on 11/01/20 at  4:00 PM EDT by a video enabled telemedicine application and verified that I am speaking with the correct person using two identifiers.  The patient and the provider were at separate locations throughout the entire encounter. Patient location: home, Provider location: work   I discussed the limitations of evaluation and management by telemedicine and the availability of in person appointments. The patient expressed understanding and agreed to proceed. The patient and the provider were the only parties present for the visit unless noted in HPI below.  History of Present Illness: The patient is a 65 y.o. man with visit for covid-19 positive. Started Thursday with symptoms. Has cough, sore throat, aches. Denies SOB. Overall it is not improving and maybe worsening slightly. Has tried otc without relief. 3 shots for covid-19  Observations/Objective: Appearance: normal, some coughing, breathing appears normal, casual grooming  Assessment and Plan: See problem oriented charting  Follow Up Instructions: rx promethazine/dm cough syrup and molnupiravir  I discussed the assessment and treatment plan with the patient. The patient was provided an opportunity to ask questions and all were answered. The patient agreed with the plan and demonstrated an understanding of the instructions.   The patient was advised to call back or seek an in-person evaluation if the symptoms worsen or if the condition fails to improve as anticipated.  Myrlene Broker, MD

## 2020-11-01 NOTE — Telephone Encounter (Signed)
Would need visit we cannot prescribe this outside visit

## 2020-11-01 NOTE — Telephone Encounter (Signed)
Can overbook patient and his wife for 4pm today please virtual.

## 2020-11-01 NOTE — Telephone Encounter (Signed)
See below

## 2020-11-01 NOTE — Telephone Encounter (Signed)
Patients wife called and said that he tested positive for Covid 19 yesterday. She said that he has cough, congestion, sore throat, body aches. She was wondering if a prescription for Paxlovid could be sent to [facility] - [place], [place] - [address]. Please advise

## 2020-11-01 NOTE — Assessment & Plan Note (Signed)
Rx molnupiravir and promethazine/dm cough syrup. °

## 2020-11-01 NOTE — Telephone Encounter (Signed)
Can you schedule

## 2020-11-01 NOTE — Telephone Encounter (Signed)
Both husband and wife are booked

## 2021-03-19 ENCOUNTER — Telehealth: Payer: Self-pay | Admitting: Internal Medicine

## 2021-03-21 MED ORDER — AMLODIPINE BESYLATE 10 MG PO TABS: 10.0000 mg | ORAL_TABLET | Freq: Every day | ORAL | 0 refills | Status: DC

## 2021-03-21 MED ORDER — ATORVASTATIN CALCIUM 40 MG PO TABS: 40.0000 mg | ORAL_TABLET | Freq: Every day | ORAL | 0 refills | Status: DC

## 2021-03-21 MED ORDER — LISINOPRIL 20 MG PO TABS: 20.0000 mg | ORAL_TABLET | Freq: Every day | ORAL | 0 refills | Status: DC

## 2021-03-21 MED ORDER — MELOXICAM 15 MG PO TABS: 15.0000 mg | ORAL_TABLET | Freq: Every day | ORAL | 0 refills | Status: DC

## 2021-03-21 MED ORDER — BACLOFEN 10 MG PO TABS: 10.0000 mg | ORAL_TABLET | Freq: Three times a day (TID) | ORAL | 0 refills | Status: DC

## 2021-03-21 MED ORDER — LISINOPRIL-HYDROCHLOROTHIAZIDE 20-25 MG PO TABS: 1.0000 | ORAL_TABLET | Freq: Every day | ORAL | 0 refills | Status: DC

## 2021-03-21 NOTE — Telephone Encounter (Signed)
Patient refill request denied bc an appt was needed... patient now has appt scheduled for 11/04  Wants to know if provider willing to submit short supply until appt for meds  Pharmacy:  [facility] - [place], [place] - [address]  Phone:  [phone] Fax:  [phone]

## 2021-03-21 NOTE — Telephone Encounter (Signed)
Okay for 30-day supply 

## 2021-03-21 NOTE — Telephone Encounter (Signed)
See below

## 2021-03-21 NOTE — Telephone Encounter (Signed)
30 day supply was sent to the patient's pharmacy.

## 2021-03-21 NOTE — Addendum Note (Signed)
Addended by: Manuela Schwartz on: 03/21/2021 04:26 PM   Modules accepted: Orders

## 2021-04-15 ENCOUNTER — Encounter: Payer: Self-pay | Admitting: Internal Medicine

## 2021-04-15 ENCOUNTER — Ambulatory Visit (INDEPENDENT_AMBULATORY_CARE_PROVIDER_SITE_OTHER): Payer: Medicare Other | Admitting: Internal Medicine

## 2021-04-15 ENCOUNTER — Other Ambulatory Visit: Payer: Self-pay

## 2021-04-15 VITALS — BP 122/80 | HR 73 | Resp 18 | Ht 71.0 in | Wt 281.0 lb

## 2021-04-15 DIAGNOSIS — E1169 Type 2 diabetes mellitus with other specified complication: Secondary | ICD-10-CM | POA: Diagnosis not present

## 2021-04-15 DIAGNOSIS — M1990 Unspecified osteoarthritis, unspecified site: Secondary | ICD-10-CM | POA: Insufficient documentation

## 2021-04-15 DIAGNOSIS — E785 Hyperlipidemia, unspecified: Secondary | ICD-10-CM | POA: Diagnosis not present

## 2021-04-15 DIAGNOSIS — I10 Essential (primary) hypertension: Secondary | ICD-10-CM

## 2021-04-15 DIAGNOSIS — M159 Polyosteoarthritis, unspecified: Secondary | ICD-10-CM | POA: Diagnosis not present

## 2021-04-15 DIAGNOSIS — E119 Type 2 diabetes mellitus without complications: Secondary | ICD-10-CM | POA: Diagnosis not present

## 2021-04-15 DIAGNOSIS — N529 Male erectile dysfunction, unspecified: Secondary | ICD-10-CM | POA: Diagnosis not present

## 2021-04-15 DIAGNOSIS — Z0000 Encounter for general adult medical examination without abnormal findings: Secondary | ICD-10-CM | POA: Diagnosis not present

## 2021-04-15 DIAGNOSIS — Z23 Encounter for immunization: Secondary | ICD-10-CM | POA: Diagnosis not present

## 2021-04-15 LAB — COMPREHENSIVE METABOLIC PANEL
ALT: 24 U/L (ref 0–53)
AST: 22 U/L (ref 0–37)
Albumin: 4.6 g/dL (ref 3.5–5.2)
Alkaline Phosphatase: 64 U/L (ref 39–117)
BUN: 17 mg/dL (ref 6–23)
CO2: 32 mEq/L (ref 19–32)
Calcium: 9.6 mg/dL (ref 8.4–10.5)
Chloride: 100 mEq/L (ref 96–112)
Creatinine, Ser: 0.9 mg/dL (ref 0.40–1.50)
GFR: 89.99 mL/min (ref >60.00)
Glucose, Bld: 103 mg/dL — ABNORMAL HIGH (ref 70–99)
Potassium: 4 mEq/L (ref 3.5–5.1)
Sodium: 139 mEq/L (ref 135–145)
Total Bilirubin: 0.6 mg/dL (ref 0.2–1.2)
Total Protein: 7.6 g/dL (ref 6.0–8.3)

## 2021-04-15 LAB — LIPID PANEL
Cholesterol: 126 mg/dL (ref 0–200)
HDL: 36.2 mg/dL — ABNORMAL LOW (ref >39.00)
LDL Cholesterol: 64 mg/dL (ref 0–99)
NonHDL: 89.95
Total CHOL/HDL Ratio: 3
Triglycerides: 128 mg/dL (ref 0.0–149.0)
VLDL: 25.6 mg/dL (ref 0.0–40.0)

## 2021-04-15 LAB — CBC
HCT: 41.7 % (ref 39.0–52.0)
Hemoglobin: 13.5 g/dL (ref 13.0–17.0)
MCHC: 32.3 g/dL (ref 30.0–36.0)
MCV: 87 fl (ref 78.0–100.0)
Platelets: 195 10*3/uL (ref 150.0–400.0)
RBC: 4.79 Mil/uL (ref 4.22–5.81)
RDW: 14.1 % (ref 11.5–15.5)
WBC: 8.4 10*3/uL (ref 4.0–10.5)

## 2021-04-15 LAB — HEMOGLOBIN A1C: Hgb A1c MFr Bld: 7 % — ABNORMAL HIGH (ref 4.6–6.5)

## 2021-04-15 MED ORDER — LISINOPRIL 20 MG PO TABS: 20.0000 mg | ORAL_TABLET | Freq: Every day | ORAL | 3 refills | Status: DC

## 2021-04-15 MED ORDER — MELOXICAM 15 MG PO TABS: 15.0000 mg | ORAL_TABLET | Freq: Every day | ORAL | 3 refills | Status: DC

## 2021-04-15 MED ORDER — AMLODIPINE BESYLATE 10 MG PO TABS: 10.0000 mg | ORAL_TABLET | Freq: Every day | ORAL | 3 refills | Status: DC

## 2021-04-15 MED ORDER — ATORVASTATIN CALCIUM 40 MG PO TABS: 40.0000 mg | ORAL_TABLET | Freq: Every day | ORAL | 3 refills | Status: DC

## 2021-04-15 MED ORDER — LISINOPRIL-HYDROCHLOROTHIAZIDE 20-25 MG PO TABS: 1.0000 | ORAL_TABLET | Freq: Every day | ORAL | 3 refills | Status: DC

## 2021-04-15 MED ORDER — BACLOFEN 10 MG PO TABS: 10.0000 mg | ORAL_TABLET | Freq: Three times a day (TID) | ORAL | 3 refills | Status: DC

## 2021-04-15 MED ORDER — TADALAFIL 20 MG PO TABS: ORAL_TABLET | ORAL | 11 refills | Status: DC

## 2021-04-15 NOTE — Assessment & Plan Note (Signed)
Uses baclofen, lidoderm patches and meloxicam. Checking CMP and adjust as needed.

## 2021-04-15 NOTE — Assessment & Plan Note (Signed)
Cialis prn. Refilled at visit.

## 2021-04-15 NOTE — Progress Notes (Signed)
Subjective:   Patient ID: Zachary Stephenson, male    DOB: [birth year], 65 y.o.   MRN: [id number]  HPI Here for medicare wellness, no new complaints. HPI #2: Please see A/P for status and treatment of chronic medical problems.   Diet: DM since diabetic Physical activity: sedentary Depression/mood screen: negative Hearing: intact to whispered voice Visual acuity: grossly normal, due for annual eye exam  ADLs: capable Fall risk: none Home safety: good Cognitive evaluation: intact to orientation, naming, recall and repetition EOL planning: adv directives discussed  Flowsheet Row Office Visit from 04/15/2021 in [facility] Total Score 0        No flowsheet data found.  I have personally reviewed and have noted 1. The patient's medical and social history - reviewed today no changes 2. Their use of alcohol, tobacco or illicit drugs 3. Their current medications and supplements 4. The patient's functional ability including ADL's, fall risks, home safety risks and hearing or visual impairment. 5. Diet and physical activities 6. Evidence for depression or mood disorders 7. Care team reviewed and updated 8.  The patient is not on an opioid pain medication.  Patient Care Team: Myrlene Broker, MD as PCP - General (Internal Medicine) Past Medical History:  Diagnosis Date   Bimalleolar fracture of left ankle    Dyspnea on exertion    HTN (hypertension)    Hyperlipidemia    Hypertension    Sleep apnea     supposed to use CPAP, does not. Will bring DOS   Type 2 diabetes mellitus (HCC)    no meds at present   Past Surgical History:  Procedure Laterality Date   ANKLE SURGERY     right ankle,both ankles   ORIF ANKLE FRACTURE Left 07/06/2014   Procedure: OPEN REDUCTION INTERNAL FIXATION (ORIF) LEFT BIMALLEOLAR ANKLE FRACTURE;  Surgeon: Cheral Almas, MD;  Location: [facility];  Service: Orthopedics;  Laterality: Left;   Family  History  Problem Relation Age of Onset   Heart attack Father    Prostate cancer Brother 52   Colon cancer Neg Hx      Review of Systems  Constitutional: Negative.   HENT: Negative.    Eyes: Negative.   Respiratory:  Negative for cough, chest tightness and shortness of breath.   Cardiovascular:  Negative for chest pain, palpitations and leg swelling.  Gastrointestinal:  Negative for abdominal distention, abdominal pain, constipation, diarrhea, nausea and vomiting.  Musculoskeletal: Negative.   Skin: Negative.   Neurological: Negative.   Psychiatric/Behavioral: Negative.     Objective:  Physical Exam Constitutional:      Appearance: He is well-developed.  HENT:     Head: Normocephalic and atraumatic.  Cardiovascular:     Rate and Rhythm: Normal rate and regular rhythm.  Pulmonary:     Effort: Pulmonary effort is normal. No respiratory distress.     Breath sounds: Normal breath sounds. No wheezing or rales.  Abdominal:     General: Bowel sounds are normal. There is no distension.     Palpations: Abdomen is soft.     Tenderness: There is no abdominal tenderness. There is no rebound.  Musculoskeletal:     Cervical back: Normal range of motion.  Skin:    General: Skin is warm and dry.     Comments: Foot exam done  Neurological:     Mental Status: He is alert and oriented to person, place, and time.     Coordination: Coordination  normal.    Vitals:   04/15/21 1513  BP: 122/80  Pulse: 73  Resp: 18  SpO2: 98%  Weight: 281 lb (127.5 kg)  Height: 5\' 11"  (1.803 m)    This visit occurred during the SARS-CoV-2 public health emergency.  Safety protocols were in place, including screening questions prior to the visit, additional usage of staff PPE, and extensive cleaning of exam room while observing appropriate contact time as indicated for disinfecting solutions.   Assessment & Plan:  Flu shot given at visit

## 2021-04-15 NOTE — Assessment & Plan Note (Signed)
Checking HgA1c, lipid panel. Taking ACE-I and statin. Adjust as needed. Currently diet controlled and previously at goal. Reminded about yearly eye exam and foot exam done.

## 2021-04-15 NOTE — Assessment & Plan Note (Signed)
BP at goal on lisinopril/hctz 40/25 mg daily dosing (takes lisinopril 20 mg and lisinopril/hctz 20/25 mg). Checking CMP and adjust as needed.

## 2021-04-15 NOTE — Assessment & Plan Note (Signed)
Flu shot given. Covid-19 booster encouraged. Pneumonia up to date. Shingrix has gotten 1st overdue for second. Tetanus due 2027. Colonoscopy due 2027. Counseled about sun safety and mole surveillance. Counseled about the dangers of distracted driving. Given 10 year screening recommendations.

## 2021-04-15 NOTE — Assessment & Plan Note (Signed)
Checking lipid panel and adjust lipitor 40 mg daily as needed. 

## 2022-04-12 ENCOUNTER — Telehealth: Payer: Self-pay | Admitting: Internal Medicine

## 2022-04-12 NOTE — Telephone Encounter (Signed)
N/A unable to leave a message for patient to call back to schedule Medicare Annual Wellness Visit   Last AWV  04/15/21  Please schedule at anytime with [facility] if patient calls the office back.      Any questions, please call me at [phone]

## 2022-05-03 ENCOUNTER — Other Ambulatory Visit: Payer: Self-pay | Admitting: Internal Medicine

## 2022-05-08 NOTE — Telephone Encounter (Signed)
Please schedule apt then ok for up to 90 day fill no refills

## 2022-05-09 ENCOUNTER — Telehealth: Payer: Self-pay | Admitting: Internal Medicine

## 2022-05-09 NOTE — Telephone Encounter (Signed)
Left Voicemail for patient

## 2022-05-09 NOTE — Telephone Encounter (Signed)
Caller & Relationship to patient: Wife  Call back number: [phone]   Date of last office visit: 11.4.23  Date of next office visit: N/A  Medication(s) to be refilled: amLODipine (NORVASC) 10 MG tablet   atorvastatin (LIPITOR) 40 MG tablet   lisinopril (ZESTRIL) 20 MG tablet   lisinopril-hydrochlorothiazide (ZESTORETIC) 20-25 MG tablet   meloxicam (MOBIC) 15 MG tablet   Preferred Pharmacy:  [facility] [phone]   Phone:[phone] [phone]   Pt's wife states that [facility] will not refill these RX's, even though there were refills given at last appointment.   Could not find any documentation from the pharmacy regarding pt.

## 2022-05-09 NOTE — Telephone Encounter (Signed)
Patient has not been seen since 04/15/2021. Spoke with patient wife and she will give [place] a call back to schedule an appointment

## 2022-05-10 NOTE — Telephone Encounter (Signed)
Pt wife called stating pt is in need of all his medications being refilled. Pt has an apptmnt for 05/19/2022 with provider and is needing a short supply of meds until his apptmnt with pcp.

## 2022-05-11 ENCOUNTER — Other Ambulatory Visit: Payer: Self-pay

## 2022-05-11 NOTE — Telephone Encounter (Signed)
Please see refill request I have already addressed this question.

## 2022-05-11 NOTE — Telephone Encounter (Signed)
Sent in refill for 90 with 0 refills for patient

## 2022-05-19 ENCOUNTER — Ambulatory Visit (INDEPENDENT_AMBULATORY_CARE_PROVIDER_SITE_OTHER): Payer: Medicare Other | Admitting: Internal Medicine

## 2022-05-19 ENCOUNTER — Encounter: Payer: Self-pay | Admitting: Internal Medicine

## 2022-05-19 ENCOUNTER — Ambulatory Visit (INDEPENDENT_AMBULATORY_CARE_PROVIDER_SITE_OTHER): Payer: Medicare Other

## 2022-05-19 VITALS — BP 138/86 | HR 76 | Temp 98.5°F | Ht 71.0 in | Wt 277.0 lb

## 2022-05-19 DIAGNOSIS — E119 Type 2 diabetes mellitus without complications: Secondary | ICD-10-CM | POA: Diagnosis not present

## 2022-05-19 DIAGNOSIS — Z23 Encounter for immunization: Secondary | ICD-10-CM

## 2022-05-19 DIAGNOSIS — M25562 Pain in left knee: Secondary | ICD-10-CM

## 2022-05-19 DIAGNOSIS — H9190 Unspecified hearing loss, unspecified ear: Secondary | ICD-10-CM

## 2022-05-19 DIAGNOSIS — M1711 Unilateral primary osteoarthritis, right knee: Secondary | ICD-10-CM | POA: Diagnosis not present

## 2022-05-19 DIAGNOSIS — M25561 Pain in right knee: Secondary | ICD-10-CM | POA: Diagnosis not present

## 2022-05-19 DIAGNOSIS — G8929 Other chronic pain: Secondary | ICD-10-CM | POA: Diagnosis not present

## 2022-05-19 DIAGNOSIS — E1169 Type 2 diabetes mellitus with other specified complication: Secondary | ICD-10-CM

## 2022-05-19 DIAGNOSIS — I10 Essential (primary) hypertension: Secondary | ICD-10-CM | POA: Diagnosis not present

## 2022-05-19 DIAGNOSIS — E785 Hyperlipidemia, unspecified: Secondary | ICD-10-CM | POA: Diagnosis not present

## 2022-05-19 DIAGNOSIS — Z0000 Encounter for general adult medical examination without abnormal findings: Secondary | ICD-10-CM | POA: Diagnosis not present

## 2022-05-19 DIAGNOSIS — M1712 Unilateral primary osteoarthritis, left knee: Secondary | ICD-10-CM | POA: Diagnosis not present

## 2022-05-19 LAB — COMPREHENSIVE METABOLIC PANEL
ALT: 29 U/L (ref 0–53)
AST: 26 U/L (ref 0–37)
Albumin: 4.9 g/dL (ref 3.5–5.2)
Alkaline Phosphatase: 62 U/L (ref 39–117)
BUN: 23 mg/dL (ref 6–23)
CO2: 34 mEq/L — ABNORMAL HIGH (ref 19–32)
Calcium: 10 mg/dL (ref 8.4–10.5)
Chloride: 100 mEq/L (ref 96–112)
Creatinine, Ser: 0.86 mg/dL (ref 0.40–1.50)
GFR: 90.53 mL/min (ref >60.00)
Glucose, Bld: 107 mg/dL — ABNORMAL HIGH (ref 70–99)
Potassium: 3.5 mEq/L (ref 3.5–5.1)
Sodium: 139 mEq/L (ref 135–145)
Total Bilirubin: 0.8 mg/dL (ref 0.2–1.2)
Total Protein: 8 g/dL (ref 6.0–8.3)

## 2022-05-19 LAB — LIPID PANEL
Cholesterol: 129 mg/dL (ref 0–200)
HDL: 44.6 mg/dL (ref >39.00)
LDL Cholesterol: 63 mg/dL (ref 0–99)
NonHDL: 84.24
Total CHOL/HDL Ratio: 3
Triglycerides: 108 mg/dL (ref 0.0–149.0)
VLDL: 21.6 mg/dL (ref 0.0–40.0)

## 2022-05-19 LAB — CBC
HCT: 43.8 % (ref 39.0–52.0)
Hemoglobin: 14.1 g/dL (ref 13.0–17.0)
MCHC: 32.3 g/dL (ref 30.0–36.0)
MCV: 87.2 fl (ref 78.0–100.0)
Platelets: 213 10*3/uL (ref 150.0–400.0)
RBC: 5.02 Mil/uL (ref 4.22–5.81)
RDW: 13.7 % (ref 11.5–15.5)
WBC: 10.2 10*3/uL (ref 4.0–10.5)

## 2022-05-19 LAB — HEMOGLOBIN A1C: Hgb A1c MFr Bld: 7 % — ABNORMAL HIGH (ref 4.6–6.5)

## 2022-05-19 MED ORDER — LISINOPRIL 20 MG PO TABS: 20.0000 mg | ORAL_TABLET | Freq: Every day | ORAL | 3 refills | Status: DC

## 2022-05-19 MED ORDER — ATORVASTATIN CALCIUM 40 MG PO TABS: 40.0000 mg | ORAL_TABLET | Freq: Every day | ORAL | 3 refills | Status: DC

## 2022-05-19 MED ORDER — BACLOFEN 10 MG PO TABS: 10.0000 mg | ORAL_TABLET | Freq: Three times a day (TID) | ORAL | 3 refills | Status: DC

## 2022-05-19 MED ORDER — AMLODIPINE BESYLATE 10 MG PO TABS: 10.0000 mg | ORAL_TABLET | Freq: Every day | ORAL | 3 refills | Status: DC

## 2022-05-19 MED ORDER — LISINOPRIL-HYDROCHLOROTHIAZIDE 20-25 MG PO TABS: 1.0000 | ORAL_TABLET | Freq: Every day | ORAL | 3 refills | Status: DC

## 2022-05-19 NOTE — Patient Instructions (Signed)
We will do the labs and the x-rays today.

## 2022-05-19 NOTE — Assessment & Plan Note (Signed)
Reported at visit and needs audiology assessment which was ordered today.

## 2022-05-19 NOTE — Assessment & Plan Note (Signed)
Pain present for several months and no prior imaging. Did order x-ray bilateral knees today to assess for presence and/or severity of arthritis. Using mobic 15 mg daily and refilled today.

## 2022-05-19 NOTE — Assessment & Plan Note (Signed)
Checking lipid panel and adjust as needed his atorvastatin 40 mg daily for LDL goal <100. Rx refill done today.

## 2022-05-19 NOTE — Progress Notes (Signed)
Subjective:   Patient ID: Zachary Stephenson, male    DOB: [birth year], 66 y.o.   MRN: [id number]  HPI Here for medicare wellness and physical, with new complaints. Please see A/P for status and treatment of chronic medical problems.   Diet: DM since diabetic Physical activity: sedentary Depression/mood screen: negative Hearing: moderate loss bilateral, needs audiology referral Visual acuity: grossly normal, performs annual eye exam  ADLs: capable Fall risk: none Home safety: good Cognitive evaluation: intact to orientation, naming, recall and repetition EOL planning: adv directives discussed  Flowsheet Row Office Visit from 05/19/2022 in [facility]  PHQ-2 Total Score 0       Flowsheet Row Office Visit from 05/19/2022 in [facility]  PHQ-9 Total Score 0          No data to display          I have personally reviewed and have noted 1. The patient's medical and social history - reviewed today no changes 2. Their use of alcohol, tobacco or illicit drugs 3. Their current medications and supplements 4. The patient's functional ability including ADL's, fall risks, home safety risks and hearing or visual impairment. 5. Diet and physical activities 6. Evidence for depression or mood disorders 7. Care team reviewed and updated 8.  The patient is not on an opioid pain medication.  Patient Care Team: Myrlene Broker, MD as PCP - General (Internal Medicine) Past Medical History:  Diagnosis Date   Bimalleolar fracture of left ankle    Dyspnea on exertion    HTN (hypertension)    Hyperlipidemia    Hypertension    Sleep apnea     supposed to use CPAP, does not. Will bring DOS   Type 2 diabetes mellitus (HCC)    no meds at present   Past Surgical History:  Procedure Laterality Date   ANKLE SURGERY     right ankle,both ankles   ORIF ANKLE FRACTURE Left 07/06/2014   Procedure: OPEN REDUCTION INTERNAL FIXATION (ORIF) LEFT  BIMALLEOLAR ANKLE FRACTURE;  Surgeon: Cheral Almas, MD;  Location: [facility];  Service: Orthopedics;  Laterality: Left;   Family History  Problem Relation Age of Onset   Heart attack Father    Prostate cancer Brother 32   Colon cancer Neg Hx    Review of Systems  Constitutional: Negative.   HENT: Negative.    Eyes: Negative.   Respiratory:  Negative for cough, chest tightness and shortness of breath.   Cardiovascular:  Negative for chest pain, palpitations and leg swelling.  Gastrointestinal:  Negative for abdominal distention, abdominal pain, constipation, diarrhea, nausea and vomiting.  Musculoskeletal:  Positive for arthralgias.  Skin: Negative.   Neurological: Negative.   Psychiatric/Behavioral: Negative.      Objective:  Physical Exam Constitutional:      Appearance: He is well-developed.  HENT:     Head: Normocephalic and atraumatic.  Cardiovascular:     Rate and Rhythm: Normal rate and regular rhythm.  Pulmonary:     Effort: Pulmonary effort is normal. No respiratory distress.     Breath sounds: Normal breath sounds. No wheezing or rales.  Abdominal:     General: Bowel sounds are normal. There is no distension.     Palpations: Abdomen is soft.     Tenderness: There is no abdominal tenderness. There is no rebound.  Musculoskeletal:        General: Tenderness present.     Cervical back: Normal range  of motion.  Skin:    General: Skin is warm and dry.     Comments: Foot exam done  Neurological:     Mental Status: He is alert and oriented to person, place, and time.     Coordination: Coordination normal.     Vitals:   05/19/22 1412  BP: 138/86  Pulse: 76  Temp: 98.5 F (36.9 C)  TempSrc: Oral  SpO2: 97%  Weight: 277 lb (125.6 kg)  Height: 5\' 11"  (1.803 m)    Assessment & Plan:

## 2022-05-19 NOTE — Assessment & Plan Note (Signed)
Flu shot given. Covid-19 getting at pharmacy. Pneumonia getting at pharmacy already scheduled. Shingrix counseled to get at pharmacy. Tetanus due 2027. Colonoscopy due 2027. Counseled about sun safety and mole surveillance. Counseled about the dangers of distracted driving. Given 10 year screening recommendations.

## 2022-05-19 NOTE — Assessment & Plan Note (Signed)
BMI 38 and complicated by diabetes, hypertension, hyperlipidemia. Working on weight loss and down 5 pounds since last year. Counseled on diet and exercise.

## 2022-05-19 NOTE — Addendum Note (Signed)
Addended by: Sumner Boast on: 05/19/2022 04:34 PM   Modules accepted: Orders

## 2022-05-19 NOTE — Assessment & Plan Note (Signed)
BP at goal on lisinopril/hctz 40/25 mg daily and amlodipine 10 mg daily. Checking CMP and adjust as needed.

## 2022-05-19 NOTE — Assessment & Plan Note (Signed)
Foot exam done. Checking microalbumin to creatinine ratio and HgA1c and lipid panel. On ACE-I and statin. Diet controlled. Will adjust as needed. Eye exam due and reminded to schedule.

## 2022-05-20 LAB — MICROALBUMIN / CREATININE URINE RATIO
Creatinine, Urine: 146 mg/dL (ref 20–320)
Microalb Creat Ratio: 25 mcg/mg creat (ref <30)
Microalb, Ur: 3.6 mg/dL

## 2022-05-21 DIAGNOSIS — Z23 Encounter for immunization: Secondary | ICD-10-CM | POA: Diagnosis not present

## 2022-08-01 ENCOUNTER — Other Ambulatory Visit: Payer: Self-pay | Admitting: Internal Medicine

## 2022-09-03 ENCOUNTER — Ambulatory Visit
Admission: EM | Admit: 2022-09-03 | Discharge: 2022-09-03 | Disposition: A | Discharge status: Home / Self Care | Payer: Medicare Other | Attending: Internal Medicine | Admitting: Internal Medicine

## 2022-09-03 DIAGNOSIS — H60391 Other infective otitis externa, right ear: Secondary | ICD-10-CM | POA: Diagnosis not present

## 2022-09-03 MED ORDER — OFLOXACIN 0.3 % OT SOLN: 10.0000 [drp] | Freq: Every day | OTIC | 0 refills | Status: AC

## 2022-09-03 NOTE — ED Provider Notes (Signed)
[facility]    CSN: [id number] Arrival date & time: 09/03/22  1002      History   Chief Complaint Chief Complaint  Patient presents with   Otalgia    HPI Zachary Stephenson is a 67 y.o. male.   Patient presents with right ear pain that started 2 days ago.  Denies trauma, foreign body, decreased hearing, drainage from the ear.  Reports that he was using an homeopathic eardrop with no improvement.  Denies any associated fever, nasal congestion, runny nose.  Patient's blood pressure is elevated.  Reports that he has been taking his blood pressure medication as prescribed.  Reports that his blood pressure is typically normal at home.  He is not reporting chest pain, shortness of breath, dizziness, headache, nausea, vomiting.   Otalgia   Past Medical History:  Diagnosis Date   Bimalleolar fracture of left ankle    Dyspnea on exertion    HTN (hypertension)    Hyperlipidemia    Hypertension    Sleep apnea     supposed to use CPAP, does not. Will bring DOS   Type 2 diabetes mellitus ([place])    no meds at present    Patient Active Problem List   Diagnosis Date Noted   Chronic pain of both knees 05/19/2022   Hearing loss 05/19/2022   Routine general medical examination at a health care facility 04/15/2021   Osteoarthritis 04/15/2021   ED (erectile dysfunction) 02/26/2017   Morbid obesity ([place]) 11/04/2015   Essential hypertension 04/14/2013   Hyperlipidemia associated with type 2 diabetes mellitus ([place]) 04/14/2013   Type 2 diabetes mellitus ([place]) 04/14/2013    Past Surgical History:  Procedure Laterality Date   ANKLE SURGERY     right ankle,both ankles   ORIF ANKLE FRACTURE Left 07/06/2014   Procedure: OPEN REDUCTION INTERNAL FIXATION (ORIF) LEFT BIMALLEOLAR ANKLE FRACTURE;  Surgeon: Marianna Payment, MD;  Location: [facility];  Service: Orthopedics;  Laterality: Left;       Home Medications    Prior to Admission medications   Medication  Sig Start Date End Date Taking? Authorizing Provider  amLODipine (NORVASC) 10 MG tablet Take 1 tablet (10 mg total) by mouth daily. 05/19/22  Yes Hoyt Koch, MD  aspirin 81 MG tablet Take 81 mg by mouth daily.   Yes [provider]  atorvastatin (LIPITOR) 40 MG tablet Take 1 tablet (40 mg total) by mouth daily. 05/19/22  Yes Hoyt Koch, MD  baclofen (LIORESAL) 10 MG tablet Take 1 tablet (10 mg total) by mouth 3 (three) times daily. 05/19/22  Yes Hoyt Koch, MD  lidocaine (LIDODERM) 5 % Place 1 patch onto the skin daily. Remove & Discard patch within 12 hours or as directed by MD 01/22/20  Yes Hoyt Koch, MD  lisinopril (ZESTRIL) 20 MG tablet Take 1 tablet (20 mg total) by mouth daily. 05/19/22  Yes Hoyt Koch, MD  lisinopril-hydrochlorothiazide (ZESTORETIC) 20-25 MG tablet Take 1 tablet by mouth daily. 05/19/22  Yes Hoyt Koch, MD  meloxicam (MOBIC) 15 MG tablet TAKE 1 TABLET BY MOUTH DAILY 08/01/22  Yes Hoyt Koch, MD  ofloxacin (FLOXIN) 0.3 % OTIC solution Place 10 drops into the right ear daily for 7 days. 09/03/22 09/10/22 Yes Loring Liskey, Hildred Alamin E, FNP  tadalafil (CIALIS) 20 MG tablet TAKE 1 TABLET BY MOUTH DAILY AS NEEDED FOR ERECTILE DYSFUNCTION. 04/15/21  Yes Hoyt Koch, MD  triamcinolone cream (KENALOG) 0.1 % Apply 1 application  topically 2 (two) times daily. 01/15/20  Yes Hoyt Koch, MD    Family History Family History  Problem Relation Age of Onset   Heart attack Father    Prostate cancer Brother 45   Colon cancer Neg Hx     Social History Social History   Tobacco Use   Smoking status: Never   Smokeless tobacco: Never  Substance Use Topics   Alcohol use: Yes    Comment: 3-4 beers weekly   Drug use: No     Allergies   Patient has no known allergies.   Review of Systems Review of Systems Per HPI  Physical Exam Triage Vital Signs ED Triage Vitals [09/03/22 1031]  Enc Vitals  Group     BP (!) 167/96     Pulse Rate 80     Resp 18     Temp 97.8 F (36.6 C)     Temp Source Oral     SpO2 98 %     Weight      Height      Head Circumference      Peak Flow      Pain Score      Pain Loc      Pain Edu?      Excl. in [place]?    No data found.  Updated Vital Signs BP (!) 151/90   Pulse 80   Temp 97.8 F (36.6 C) (Oral)   Resp 18   SpO2 98%   Visual Acuity Right Eye Distance:   Left Eye Distance:   Bilateral Distance:    Right Eye Near:   Left Eye Near:    Bilateral Near:     Physical Exam Constitutional:      General: He is not in acute distress.    Appearance: Normal appearance. He is not toxic-appearing or diaphoretic.  HENT:     Head: Normocephalic and atraumatic.     Right Ear: External ear normal.     Left Ear: Tympanic membrane, ear canal and external ear normal.     Ears:     Comments: Patient has erythema and mild swelling present to right external canal.  I am not able to visualize the TM due to swelling. Eyes:     Extraocular Movements: Extraocular movements intact.     Conjunctiva/sclera: Conjunctivae normal.  Pulmonary:     Effort: Pulmonary effort is normal.  Neurological:     General: No focal deficit present.     Mental Status: He is alert and oriented to person, place, and time. Mental status is at baseline.  Psychiatric:        Mood and Affect: Mood normal.        Behavior: Behavior normal.        Thought Content: Thought content normal.        Judgment: Judgment normal.      UC Treatments / Results  Labs (all labs ordered are listed, but only abnormal results are displayed) Labs Reviewed - No data to display  EKG   Radiology No results found.  Procedures Procedures (including critical care time)  Medications Ordered in UC Medications - No data to display  Initial Impression / Assessment and Plan / UC Course  I have reviewed the triage vital signs and the nursing notes.  Pertinent labs & imaging results  that were available during my care of the patient were reviewed by me and considered in my medical decision making (see chart for details).  Physical exam is consistent with right otitis externa.  I am not able to visualize the TM so will treat with ofloxacin which should be safe.  Advised patient to follow-up if any symptoms persist or worsen.  Blood pressure mildly elevated with recheck being slightly improved.  Reports his blood pressure is typically normal at home and he has been taking his blood pressure as prescribed.  He is asymptomatic regarding blood pressure so the patient was encouraged to monitor blood pressure at home and follow-up if it remains elevated.  Patient verbalized understanding and was agreeable with plan.  Patient wished for his family member to interpret for him. Final Clinical Impressions(s) / UC Diagnoses   Final diagnoses:  Other infective acute otitis externa of right ear     Discharge Instructions      It appears that you have an infection of the canal of your ear.  I have prescribed antibiotic drops to help alleviate this.  Follow-up if any symptoms persist or worsen.    ED Prescriptions     Medication Sig Dispense Auth. Provider   ofloxacin (FLOXIN) 0.3 % OTIC solution Place 10 drops into the right ear daily for 7 days. 5 mL Teodora Medici, [place]      PDMP not reviewed this encounter.   Teodora Medici, [place] 09/03/22 1147

## 2022-09-03 NOTE — ED Triage Notes (Signed)
Pt is here for right  ear pain and pressure x 2 days. Pt is using Natural Ear drops.

## 2022-09-03 NOTE — Discharge Instructions (Signed)
It appears that you have an infection of the canal of your ear.  I have prescribed antibiotic drops to help alleviate this.  Follow-up if any symptoms persist or worsen.

## 2022-10-16 ENCOUNTER — Other Ambulatory Visit: Payer: Self-pay | Admitting: Internal Medicine

## 2023-01-03 DIAGNOSIS — S0191XA Laceration without foreign body of unspecified part of head, initial encounter: Secondary | ICD-10-CM | POA: Diagnosis not present

## 2023-02-15 ENCOUNTER — Other Ambulatory Visit (HOSPITAL_COMMUNITY): Payer: Self-pay

## 2023-02-16 ENCOUNTER — Other Ambulatory Visit: Payer: Self-pay | Admitting: Internal Medicine

## 2023-02-17 DIAGNOSIS — Z23 Encounter for immunization: Secondary | ICD-10-CM | POA: Diagnosis not present

## 2023-03-25 DIAGNOSIS — S8012XA Contusion of left lower leg, initial encounter: Secondary | ICD-10-CM | POA: Diagnosis not present

## 2023-05-17 ENCOUNTER — Other Ambulatory Visit: Payer: Self-pay | Admitting: Internal Medicine

## 2023-08-01 ENCOUNTER — Ambulatory Visit: Payer: Medicare Other | Admitting: Internal Medicine

## 2023-08-15 ENCOUNTER — Other Ambulatory Visit: Payer: Self-pay | Admitting: Internal Medicine

## 2023-08-23 ENCOUNTER — Encounter: Payer: Self-pay | Admitting: Internal Medicine

## 2023-08-23 ENCOUNTER — Ambulatory Visit: Payer: Medicare Other | Admitting: Internal Medicine

## 2023-08-23 ENCOUNTER — Other Ambulatory Visit (HOSPITAL_COMMUNITY): Payer: Self-pay

## 2023-08-23 VITALS — BP 138/82 | Ht 71.0 in | Wt 291.0 lb

## 2023-08-23 DIAGNOSIS — E1169 Type 2 diabetes mellitus with other specified complication: Secondary | ICD-10-CM

## 2023-08-23 DIAGNOSIS — M150 Primary generalized (osteo)arthritis: Secondary | ICD-10-CM

## 2023-08-23 DIAGNOSIS — E785 Hyperlipidemia, unspecified: Secondary | ICD-10-CM | POA: Diagnosis not present

## 2023-08-23 DIAGNOSIS — G8929 Other chronic pain: Secondary | ICD-10-CM

## 2023-08-23 DIAGNOSIS — M25561 Pain in right knee: Secondary | ICD-10-CM | POA: Diagnosis not present

## 2023-08-23 DIAGNOSIS — M25562 Pain in left knee: Secondary | ICD-10-CM | POA: Diagnosis not present

## 2023-08-23 DIAGNOSIS — Z0000 Encounter for general adult medical examination without abnormal findings: Secondary | ICD-10-CM | POA: Diagnosis not present

## 2023-08-23 DIAGNOSIS — H9190 Unspecified hearing loss, unspecified ear: Secondary | ICD-10-CM | POA: Diagnosis not present

## 2023-08-23 DIAGNOSIS — I10 Essential (primary) hypertension: Secondary | ICD-10-CM | POA: Diagnosis not present

## 2023-08-23 LAB — COMPREHENSIVE METABOLIC PANEL
ALT: 29 U/L (ref 0–53)
AST: 21 U/L (ref 0–37)
Albumin: 4.8 g/dL (ref 3.5–5.2)
Alkaline Phosphatase: 66 U/L (ref 39–117)
BUN: 15 mg/dL (ref 6–23)
CO2: 31 meq/L (ref 19–32)
Calcium: 10.2 mg/dL (ref 8.4–10.5)
Chloride: 100 meq/L (ref 96–112)
Creatinine, Ser: 0.97 mg/dL (ref 0.40–1.50)
GFR: 80.9 mL/min (ref >60.00)
Glucose, Bld: 146 mg/dL — ABNORMAL HIGH (ref 70–99)
Potassium: 3.9 meq/L (ref 3.5–5.1)
Sodium: 141 meq/L (ref 135–145)
Total Bilirubin: 0.6 mg/dL (ref 0.2–1.2)
Total Protein: 7.8 g/dL (ref 6.0–8.3)

## 2023-08-23 LAB — CBC
HCT: 44.4 % (ref 39.0–52.0)
Hemoglobin: 14.6 g/dL (ref 13.0–17.0)
MCHC: 32.8 g/dL (ref 30.0–36.0)
MCV: 87.4 fl (ref 78.0–100.0)
Platelets: 194 10*3/uL (ref 150.0–400.0)
RBC: 5.08 Mil/uL (ref 4.22–5.81)
RDW: 13.8 % (ref 11.5–15.5)
WBC: 8.6 10*3/uL (ref 4.0–10.5)

## 2023-08-23 LAB — MICROALBUMIN / CREATININE URINE RATIO
Creatinine,U: 102 mg/dL
Microalb Creat Ratio: 35.5 mg/g — ABNORMAL HIGH (ref 0.0–30.0)
Microalb, Ur: 3.6 mg/dL — ABNORMAL HIGH (ref 0.0–1.9)

## 2023-08-23 LAB — LIPID PANEL
Cholesterol: 153 mg/dL (ref 0–200)
HDL: 36.4 mg/dL — ABNORMAL LOW (ref >39.00)
LDL Cholesterol: 88 mg/dL (ref 0–99)
NonHDL: 116.39
Total CHOL/HDL Ratio: 4
Triglycerides: 144 mg/dL (ref 0.0–149.0)
VLDL: 28.8 mg/dL (ref 0.0–40.0)

## 2023-08-23 LAB — VITAMIN B12: Vitamin B-12: 278 pg/mL (ref 211–911)

## 2023-08-23 LAB — HEMOGLOBIN A1C: Hgb A1c MFr Bld: 7.7 % — ABNORMAL HIGH (ref 4.6–6.5)

## 2023-08-23 MED ORDER — OZEMPIC (0.25 OR 0.5 MG/DOSE) 2 MG/3ML ~~LOC~~ SOPN
PEN_INJECTOR | SUBCUTANEOUS | 0 refills | Status: DC
  Filled 2023-08-23: qty 6, 56d supply, fill #0

## 2023-08-23 MED ORDER — OZEMPIC (0.25 OR 0.5 MG/DOSE) 2 MG/3ML ~~LOC~~ SOPN: PEN_INJECTOR | SUBCUTANEOUS | 0 refills | Status: DC

## 2023-08-23 NOTE — Progress Notes (Addendum)
 Subjective:   Patient ID: Zachary Stephenson, male    DOB: [birth year], 68 y.o.   MRN: [id number]  HPI Here for medicare wellness and follow up, no new complaints. Please see A/P for status and treatment of chronic medical problems.   Diet: DM since diabetic Physical activity: sedentary Depression/mood screen: negative Hearing: mild to moderate loss needs referral to audiology which is done Visual acuity: grossly normal with glasses, performs annual eye exam  ADLs: capable Fall risk: none Home safety: good Cognitive evaluation: intact to orientation, naming, recall and repetition EOL planning: adv directives discussed, not in place  [employer] Visit from 08/23/2023 in [facility] [place]  PHQ-2 Total Score 0       Flowsheet Row Office Visit from 05/19/2022 in [facility] [place]  PHQ-9 Total Score 0         08/23/2023    8:06 AM 08/23/2023    8:15 AM  Fall Risk  Falls in the past year? 0 0  Was there an injury with Fall? 0 0  Fall Risk Category Calculator 0 0  Fall risk Follow up Falls evaluation completed Falls evaluation completed    I have personally reviewed and have noted 1. The patient's medical and social history - reviewed today no changes 2. Their use of alcohol, tobacco or illicit drugs 3. Their current medications and supplements 4. The patient's functional ability including ADL's, fall risks, home safety risks and hearing or visual impairment. 5. Diet and physical activities 6. Evidence for depression or mood disorders 7. Care team reviewed and updated 8.  The patient is not on an opioid pain medication  Patient Care Team: Myrlene Broker, MD as PCP - General (Internal Medicine) Past Medical History:  Diagnosis Date   Bimalleolar fracture of left ankle    Dyspnea on exertion    HTN (hypertension)    Hyperlipidemia    Hypertension    Sleep apnea     supposed to use CPAP, does not.  Will bring DOS   Type 2 diabetes mellitus ([facility])    no meds at present   Past Surgical History:  Procedure Laterality Date   ANKLE SURGERY     right ankle,both ankles   ORIF ANKLE FRACTURE Left 07/06/2014   Procedure: OPEN REDUCTION INTERNAL FIXATION (ORIF) LEFT BIMALLEOLAR ANKLE FRACTURE;  Surgeon: Cheral Almas, MD;  Location: [facility];  Service: Orthopedics;  Laterality: Left;   Family History  Problem Relation Age of Onset   Heart attack Father    Prostate cancer Brother 61   Colon cancer Neg Hx    Review of Systems  Constitutional: Negative.   HENT: Negative.    Eyes: Negative.   Respiratory:  Negative for cough, chest tightness and shortness of breath.   Cardiovascular:  Negative for chest pain, palpitations and leg swelling.  Gastrointestinal:  Negative for abdominal distention, abdominal pain, constipation, diarrhea, nausea and vomiting.  Musculoskeletal:  Positive for arthralgias and myalgias.  Skin: Negative.   Neurological: Negative.   Psychiatric/Behavioral: Negative.      Objective:  Physical Exam Constitutional:      Appearance: He is well-developed.  HENT:     Head: Normocephalic and atraumatic.  Cardiovascular:     Rate and Rhythm: Normal rate and regular rhythm.  Pulmonary:     Effort: Pulmonary effort is normal. No respiratory distress.     Breath sounds: Normal breath sounds. No wheezing or rales.  Abdominal:     General: Bowel sounds are normal. There is no distension.     Palpations: Abdomen is soft.     Tenderness: There is no abdominal tenderness. There is no rebound.  Musculoskeletal:        General: Tenderness present.     Cervical back: Normal range of motion.  Skin:    General: Skin is warm and dry.  Neurological:     Mental Status: He is alert and oriented to person, place, and time.     Coordination: Coordination normal.     Vitals:   08/23/23 0806  BP: 138/82  TempSrc: Oral  Weight: 291 lb (132 kg)  Height:  5\' 11"  (1.803 m)    Assessment & Plan:

## 2023-08-23 NOTE — Patient Instructions (Addendum)
 Try the epley maneuver to help clear the dizziness if it comes.  Try turmeric to help the arthritis which is a vitamin over the counter.

## 2023-08-23 NOTE — Assessment & Plan Note (Signed)
 Worsening chronic pain in the knees.

## 2023-08-23 NOTE — Assessment & Plan Note (Signed)
BP at goal on lisinopril 20 mg daily. Checking CMP and adjust as needed.

## 2023-08-23 NOTE — Assessment & Plan Note (Signed)
 Weight stable. We will start ozempic for diabetes control and weight.

## 2023-08-23 NOTE — Assessment & Plan Note (Signed)
 Flu shot up to date. Pneumonia complete. Shingrix counseled. Tetanus up to date. Colonoscopy due 2027. Counseled about sun safety and mole surveillance. Counseled about the dangers of distracted driving. Given 10 year screening recommendations.

## 2023-08-23 NOTE — Assessment & Plan Note (Signed)
Checking lipid panel and adjust lipitor 40 mg daily as needed. LDL goal <100. 

## 2023-08-23 NOTE — Assessment & Plan Note (Signed)
 Checking HgA1c and microalbumin to creatinine ratio and CMP and lipid panel. Adjust as needed. On ACE-I and statin. Diet controlled but needs more control. Starting ozempic.

## 2023-08-23 NOTE — Assessment & Plan Note (Signed)
 Referral to audiology.

## 2023-08-23 NOTE — Assessment & Plan Note (Signed)
 Reviewed x-ray with arthritis. Using tylenol for pain and encouraged turmeric for pain control.

## 2023-08-27 ENCOUNTER — Encounter: Payer: Self-pay | Admitting: Internal Medicine

## 2023-08-27 ENCOUNTER — Other Ambulatory Visit (HOSPITAL_COMMUNITY): Payer: Self-pay

## 2023-08-27 ENCOUNTER — Telehealth: Payer: Self-pay

## 2023-08-27 NOTE — Telephone Encounter (Signed)
 Pharmacy Patient Advocate Encounter  Received notification from [facility] that Prior Authorization for [facility] 0.25MG  has been APPROVED from 08/27/23 to 06/11/24. Ran test claim, Copay is $94.00. This test claim was processed through [facility]- copay amounts may vary at other pharmacies due to pharmacy/plan contracts, or as the patient moves through the different stages of their insurance plan.   PA #/Case ID/Reference #: [id number]

## 2023-08-27 NOTE — Telephone Encounter (Signed)
 Pharmacy Patient Advocate Encounter   Received notification from Onbase that prior authorization for Ozempic (0.25 or 0.5 MG/DOSE) 2MG /3ML pen-injectors is required/requested.   Insurance verification completed.   The patient is insured through [facility] .   Per test claim: PA required; PA submitted to above mentioned insurance via CoverMyMeds Key/confirmation #/EOC [id number] Status is pending

## 2023-10-07 ENCOUNTER — Telehealth: Payer: Self-pay | Admitting: Internal Medicine

## 2023-10-08 NOTE — Telephone Encounter (Signed)
 Copied from CRM [id number]. Topic: Clinical - Medication Question >> Oct 08, 2023 12:25 PM Zachary Stephenson wrote: Reason for CRM: Pt is only down to 1 pill, pt requesting refills- I did advise it was pending. Atorvastatin  Calcium  40 MG TAKE 1 TABLET(40 MG) BY MOUTH DAILY amLODIPine  Besylate 10 MG TAKE 1 TABLET(10 MG) BY MOUTH DAILY Lisinopril  20 MG TAKE 1 TABLET(20 MG) BY MOUTH DAILY Lisinopril -hydroCHLOROthiazide 

## 2023-10-09 NOTE — Telephone Encounter (Signed)
This has already been sent in.

## 2023-10-20 DIAGNOSIS — M542 Cervicalgia: Secondary | ICD-10-CM | POA: Diagnosis not present

## 2023-10-20 DIAGNOSIS — M19012 Primary osteoarthritis, left shoulder: Secondary | ICD-10-CM | POA: Diagnosis not present

## 2023-10-20 DIAGNOSIS — M25512 Pain in left shoulder: Secondary | ICD-10-CM | POA: Diagnosis not present

## 2023-11-19 ENCOUNTER — Other Ambulatory Visit: Payer: Self-pay | Admitting: Internal Medicine

## 2024-03-20 DIAGNOSIS — Z23 Encounter for immunization: Secondary | ICD-10-CM | POA: Diagnosis not present

## 2024-04-18 ENCOUNTER — Telehealth: Payer: Self-pay

## 2024-04-18 NOTE — Telephone Encounter (Signed)
 Copied from CRM [id number]. Topic: General - Other >> Apr 18, 2024 11:20 AM Rosina BIRCH wrote: Reason for CRM: patient wife called stating next year the patient will be getting his medication through mail order. [employer] will be sending a fax today. The wife stated she was instructed to call the office [phone]

## 2024-04-18 NOTE — Telephone Encounter (Signed)
 Spoke with patient's wife and informed her that we have not received any paperwork from [facility]. Will continue to check and notify wife when they have been received.

## 2024-04-28 ENCOUNTER — Telehealth: Payer: Self-pay

## 2024-04-28 NOTE — Telephone Encounter (Signed)
 Copied from CRM #[id number]. Topic: General - Other >> Apr 28, 2024 10:03 AM Thersia BROCKS wrote: Reason for CRM: Patient wife, Sharyne called in regarding the status of the paperwork from [facility] , would like a callback regarding this .  [phone]

## 2024-04-29 ENCOUNTER — Other Ambulatory Visit: Payer: Self-pay | Admitting: Internal Medicine

## 2024-04-29 NOTE — Telephone Encounter (Unsigned)
 Copied from CRM [id number]. Topic: Clinical - Medication Refill >> Apr 29, 2024 11:38 AM Nessti S wrote: Medication: atorvastatin  (LIPITOR) 40 MG tablet, lisinopril  (ZESTRIL ) 20 MG tablet, amLODipine  (NORVASC ) 10 MG tablet, meloxicam  (MOBIC ) 15 MG tablet  Has the patient contacted their pharmacy? Yes (Agent: If no, request that the patient contact the pharmacy for the refill. If patient does not wish to contact the pharmacy document the reason why and proceed with request.) (Agent: If yes, when and what did the pharmacy advise?)  This is the patient's preferred pharmacy:  [facility] - [place], [place] - [address] [place] [address] Phone: [phone] Fax: [phone] Hours: Not open 24 hours  Is this the correct pharmacy for this prescription? Yes If no, delete pharmacy and type the correct one.   Has the prescription been filled recently? No  Is the patient out of the medication? Yes  Has the patient been seen for an appointment in the last year OR does the patient have an upcoming appointment? Yes  Can we respond through MyChart? No  Agent: Please be advised that Rx refills may take up to 3 business days. We ask that you follow-up with your pharmacy.

## 2024-05-01 MED ORDER — ATORVASTATIN CALCIUM 40 MG PO TABS: 40.0000 mg | ORAL_TABLET | Freq: Every day | ORAL | 3 refills | Status: AC

## 2024-05-01 MED ORDER — LISINOPRIL 20 MG PO TABS: 20.0000 mg | ORAL_TABLET | Freq: Every day | ORAL | 3 refills | Status: AC

## 2024-05-01 MED ORDER — AMLODIPINE BESYLATE 10 MG PO TABS: 10.0000 mg | ORAL_TABLET | Freq: Every day | ORAL | 3 refills | Status: AC

## 2024-05-01 MED ORDER — MELOXICAM 15 MG PO TABS: 15.0000 mg | ORAL_TABLET | Freq: Every day | ORAL | 0 refills | Status: AC

## 2024-05-01 NOTE — Telephone Encounter (Signed)
 Spoke to wife, Sharyne and informed her that we still have not received anything. Gave her our fax number to ensure tht they have the correct number. Will contact wife when paperwork has been received.

## 2024-05-05 ENCOUNTER — Encounter: Payer: Self-pay | Admitting: Internal Medicine

## 2024-05-05 ENCOUNTER — Ambulatory Visit: Admitting: Internal Medicine

## 2024-05-05 VITALS — BP 122/70 | HR 74 | Temp 97.9°F | Ht 71.0 in | Wt 288.2 lb

## 2024-05-05 DIAGNOSIS — M25512 Pain in left shoulder: Secondary | ICD-10-CM

## 2024-05-05 DIAGNOSIS — L719 Rosacea, unspecified: Secondary | ICD-10-CM

## 2024-05-05 DIAGNOSIS — G8929 Other chronic pain: Secondary | ICD-10-CM | POA: Diagnosis not present

## 2024-05-05 DIAGNOSIS — E1169 Type 2 diabetes mellitus with other specified complication: Secondary | ICD-10-CM

## 2024-05-05 LAB — COMPREHENSIVE METABOLIC PANEL WITH GFR
ALT: 25 U/L (ref 0–53)
AST: 18 U/L (ref 0–37)
Albumin: 4.9 g/dL (ref 3.5–5.2)
Alkaline Phosphatase: 66 U/L (ref 39–117)
BUN: 15 mg/dL (ref 6–23)
CO2: 31 meq/L (ref 19–32)
Calcium: 9.7 mg/dL (ref 8.4–10.5)
Chloride: 98 meq/L (ref 96–112)
Creatinine, Ser: 0.8 mg/dL (ref 0.40–1.50)
GFR: 91.27 mL/min (ref >60.00)
Glucose, Bld: 150 mg/dL — ABNORMAL HIGH (ref 70–99)
Potassium: 3.9 meq/L (ref 3.5–5.1)
Sodium: 138 meq/L (ref 135–145)
Total Bilirubin: 0.6 mg/dL (ref 0.2–1.2)
Total Protein: 7.8 g/dL (ref 6.0–8.3)

## 2024-05-05 LAB — CBC
HCT: 43.2 % (ref 39.0–52.0)
Hemoglobin: 14 g/dL (ref 13.0–17.0)
MCHC: 32.4 g/dL (ref 30.0–36.0)
MCV: 86.6 fl (ref 78.0–100.0)
Platelets: 190 K/uL (ref 150.0–400.0)
RBC: 4.98 Mil/uL (ref 4.22–5.81)
RDW: 13.6 % (ref 11.5–15.5)
WBC: 8.4 K/uL (ref 4.0–10.5)

## 2024-05-05 LAB — HEMOGLOBIN A1C: Hgb A1c MFr Bld: 7.4 % — ABNORMAL HIGH (ref 4.6–6.5)

## 2024-05-05 MED ORDER — PREDNISONE 20 MG PO TABS: 40.0000 mg | ORAL_TABLET | Freq: Every day | ORAL | 0 refills | Status: AC

## 2024-05-05 MED ORDER — TRIAMCINOLONE ACETONIDE 0.1 % EX CREA: 1.0000 | TOPICAL_CREAM | Freq: Two times a day (BID) | CUTANEOUS | 0 refills | Status: AC

## 2024-05-05 MED ORDER — METRONIDAZOLE 0.75 % EX LOTN: TOPICAL_LOTION | CUTANEOUS | 0 refills | Status: AC

## 2024-05-05 NOTE — Progress Notes (Signed)
   Subjective:   Patient ID: Zachary Stephenson, male    DOB: 1956-03-22, 68 y.o.   MRN: [id number]  Discussed the use of AI scribe software for clinical note transcription with the patient, who gave verbal consent to proceed.  History of Present Illness Zachary Stephenson is a 68 year old male who presents with neck and shoulder pain.  He has been experiencing throbbing and sharp pain in the upper shoulder and neck area for a couple of months. The pain occurs daily, has worsened over time, and was initially intermittent but is now present almost every day. It is exacerbated by movement and can occur even when sitting or sleeping, often waking him up at night. He has tried Tylenol , ointments, and patches without relief.  There is no numbness or weakness associated with the pain, and he denies any history of injury to the shoulder. There is no limitation in neck movement.  Additionally, he is experiencing facial irritation with bumps and a burning sensation, especially aggravated by shaving. No numbness, weakness, or dropping objects.  Review of Systems  Constitutional: Negative.   HENT: Negative.    Eyes: Negative.   Respiratory:  Negative for cough, chest tightness and shortness of breath.   Cardiovascular:  Negative for chest pain, palpitations and leg swelling.  Gastrointestinal:  Negative for abdominal distention, abdominal pain, constipation, diarrhea, nausea and vomiting.  Musculoskeletal:  Positive for arthralgias.  Skin:  Positive for color change.  Neurological: Negative.   Psychiatric/Behavioral: Negative.      Objective:  Physical Exam Constitutional:      Appearance: He is well-developed.  HENT:     Head: Normocephalic and atraumatic.  Cardiovascular:     Rate and Rhythm: Normal rate and regular rhythm.  Pulmonary:     Effort: Pulmonary effort is normal. No respiratory distress.     Breath sounds: Normal breath sounds. No wheezing or rales.  Abdominal:     General: Bowel  sounds are normal. There is no distension.     Palpations: Abdomen is soft.     Tenderness: There is no abdominal tenderness.  Musculoskeletal:        General: Tenderness present.     Cervical back: Normal range of motion.     Comments: Left AC tenderness and shoulder tenderness worse with ROM which is limited by pain  Skin:    General: Skin is warm and dry.     Comments: Some bumps on the cheeks consistent with rosacea  Neurological:     Mental Status: He is alert and oriented to person, place, and time.     Coordination: Coordination normal.     Vitals:   05/05/24 0848  BP: 122/70  Pulse: 74  Temp: 97.9 F (36.6 C)  TempSrc: Oral  SpO2: 94%  Weight: 288 lb 3.2 oz (130.7 kg)  Height: 5' 11 (1.803 m)   Assessment and Plan Assessment & Plan Left shoulder and neck pain   Chronic pain in the left shoulder and neck, likely due to issues with the shoulder and rotator cuff tendon. Previous treatments have been ineffective. Prescribe prednisone  for 5 days to reduce inflammation. Consider a sports medicine referral if there is no improvement after prednisone , which may include imaging or a corticosteroid injection. Defer x-ray unless prednisone  is ineffective. Advise follow-up if symptoms persist after prednisone .  rosacea   Burning sensation and bumps on the face, worsened by shaving, with no specific cause identified. Prescribe topical metronidazole  cream for irritation.

## 2024-05-05 NOTE — Assessment & Plan Note (Signed)
 Checking HgA1c and CBC and CMP today and adjust regimen as needed.

## 2024-05-05 NOTE — Assessment & Plan Note (Signed)
 Rx prednisone  5  day course and if not helpful refer to sports medicine for further assessment/treatment.

## 2024-05-05 NOTE — Assessment & Plan Note (Signed)
 Rx metronidazole  cream to use daily on the face.

## 2024-05-05 NOTE — Patient Instructions (Signed)
 We have sent in prednisone  to take 2 pills daily for 5 days.  Let us  know if not doing better.  We will check the labs today.  We have sent in the metronidazole  cream to use on the face daily to help.

## 2024-05-07 ENCOUNTER — Ambulatory Visit: Payer: Self-pay | Admitting: Internal Medicine

## 2024-05-15 ENCOUNTER — Telehealth: Payer: Self-pay

## 2024-05-15 NOTE — Telephone Encounter (Signed)
 Copied from CRM #[id number]. Topic: Clinical - Lab/Test Results >> May 15, 2024  2:20 PM Lauren C wrote: Reason for CRM: FYI- Pts wife (on dpr, but not on flags) called for lab results, results relayed. No further questions.

## 2024-05-19 ENCOUNTER — Other Ambulatory Visit (HOSPITAL_COMMUNITY): Payer: Self-pay

## 2024-06-19 ENCOUNTER — Telehealth: Payer: Self-pay | Admitting: Internal Medicine

## 2024-06-19 NOTE — Telephone Encounter (Unsigned)
 Copied from CRM #[id number]. Topic: Clinical - Prescription Issue >> Jun 19, 2024  1:23 PM Antwanette L wrote: Reason for CRM: Sharyne, the patient's wife, is calling because [facility] did not receive a prescription for lisinopril -hydrochlorothiazide  (Zestoretic ) 20-25 mg tablet. Please have Dr. Rollene fax the prescription to [facility] at [phone]

## 2024-06-23 MED ORDER — LISINOPRIL-HYDROCHLOROTHIAZIDE 20-25 MG PO TABS: 1.0000 | ORAL_TABLET | Freq: Every day | ORAL | 3 refills | Status: DC

## 2024-06-23 NOTE — Telephone Encounter (Signed)
Rx sent to [facility].

## 2024-06-23 NOTE — Addendum Note (Signed)
 Addended by: Ridwan Bondy E on: 06/23/2024 04:01 PM   Modules accepted: Orders

## 2024-06-23 NOTE — Telephone Encounter (Signed)
 Is pt on both lisinopril  20 mg and lisinopril -hydrochlorothiazide ? Wanted to confirm

## 2024-06-23 NOTE — Telephone Encounter (Signed)
 Yes he takes both lisinopril  20 mg daily and lisinopril /hydrochlorothiazide  20/25 to equal total daily dosing 40/25 mg lisinopri/hctz

## 2024-06-28 ENCOUNTER — Other Ambulatory Visit: Payer: Self-pay | Admitting: Internal Medicine

## 2024-07-01 ENCOUNTER — Ambulatory Visit (INDEPENDENT_AMBULATORY_CARE_PROVIDER_SITE_OTHER): Admitting: Emergency Medicine

## 2024-07-01 ENCOUNTER — Telehealth: Payer: Self-pay

## 2024-07-01 ENCOUNTER — Ambulatory Visit: Payer: Self-pay

## 2024-07-01 VITALS — BP 142/80 | HR 82 | Temp 97.9°F | Ht 71.0 in | Wt 290.0 lb

## 2024-07-01 DIAGNOSIS — Z7984 Long term (current) use of oral hypoglycemic drugs: Secondary | ICD-10-CM | POA: Diagnosis not present

## 2024-07-01 DIAGNOSIS — E1159 Type 2 diabetes mellitus with other circulatory complications: Secondary | ICD-10-CM | POA: Diagnosis not present

## 2024-07-01 DIAGNOSIS — M25512 Pain in left shoulder: Secondary | ICD-10-CM | POA: Diagnosis not present

## 2024-07-01 DIAGNOSIS — Z8669 Personal history of other diseases of the nervous system and sense organs: Secondary | ICD-10-CM | POA: Insufficient documentation

## 2024-07-01 DIAGNOSIS — E785 Hyperlipidemia, unspecified: Secondary | ICD-10-CM

## 2024-07-01 DIAGNOSIS — G4730 Sleep apnea, unspecified: Secondary | ICD-10-CM | POA: Insufficient documentation

## 2024-07-01 DIAGNOSIS — G8929 Other chronic pain: Secondary | ICD-10-CM | POA: Diagnosis not present

## 2024-07-01 DIAGNOSIS — G4733 Obstructive sleep apnea (adult) (pediatric): Secondary | ICD-10-CM | POA: Diagnosis not present

## 2024-07-01 DIAGNOSIS — R42 Dizziness and giddiness: Secondary | ICD-10-CM | POA: Insufficient documentation

## 2024-07-01 DIAGNOSIS — I152 Hypertension secondary to endocrine disorders: Secondary | ICD-10-CM

## 2024-07-01 DIAGNOSIS — E1169 Type 2 diabetes mellitus with other specified complication: Secondary | ICD-10-CM

## 2024-07-01 MED ORDER — METFORMIN HCL 500 MG PO TABS: 500.0000 mg | ORAL_TABLET | Freq: Two times a day (BID) | ORAL | 3 refills | Status: AC

## 2024-07-01 MED ORDER — LISINOPRIL-HYDROCHLOROTHIAZIDE 20-25 MG PO TABS: 1.0000 | ORAL_TABLET | Freq: Every day | ORAL | 3 refills | Status: AC

## 2024-07-01 NOTE — Assessment & Plan Note (Signed)
 Clinically stable.  No red flag signs or symptoms Unremarkable physical examination.  Stable vital signs Normal EKG. Benign presentation.  Differential diagnosis discussed Recommend blood work today Advised to stay well-hydrated and rest

## 2024-07-01 NOTE — Assessment & Plan Note (Signed)
 Chronic pain Takes meloxicam  15 mg as needed Pain management discussed Needs orthopedic evaluation

## 2024-07-01 NOTE — Telephone Encounter (Signed)
 FYI Only or Action Required?: FYI only for provider: appointment scheduled on today.  Patient was last seen in primary care on 05/05/2024 by Rollene Almarie LABOR, MD.  Called Nurse Triage reporting Dizziness and Hypoglycemia.  Symptoms began today.  Interventions attempted: Dietary changes-toast, raisins, orange juice   Symptoms are: rapidly improving.  Triage Disposition: See Physician Within 24 Hours  Patient/caregiver understands and will follow disposition?: Yes   Reason for Disposition  [1] Blood glucose 70 mg/dL (3.9 mmol/L) or below OR symptomatic, now improved with Care Advice AND [2] cause unknown  Taking a medicine that could cause dizziness (e.g., blood pressure medications, diuretics)  Answer Assessment - Initial Assessment Questions Spouse calling to schedule an appointment for symptoms of low blood sugar. Rain woke up this morning feeling dizzy and needed to hold onto wall to ambulate, he told his spouse he felt his blood sugar was low. Glucometer is not functioning and has not checked blood sugar in approximately one month. Spouse had him eat toast, raisin, and orange juice, he laid down while she called clinic to schedule, he now reports symptoms are resolving. No longer feels his sugar is low. Advised now that symptoms of low blood sugar are rapidly resolving to eat small meal/snack such as peanut butter crackers, sandwich, milk.  Scheduled appointment today.   1. SYMPTOMS: What symptoms are you concerned about?     dizziness 2. ONSET:  When did the symptoms start?     This morning  3. BLOOD GLUCOSE: What is your blood glucose level?      Machine not working 4. USUAL RANGE: What is your blood glucose level usually? (e.g., usual fasting morning value, usual evening value)     Unknown-last checked one month ago 5. TYPE 1 or 2:  Do you know what type of diabetes you have?  (e.g., Type 1, Type 2, Gestational; doesn't know)       6. INSULIN: Do you take  insulin? What type of insulin(s) do you use? What is the mode of delivery? (syringe, pen; injection or pump) When did you last give yourself an insulin dose? (i.e., time or hours/minutes ago) How much did you give? (i.e., how many units)     no 7. DIABETES PILLS: Do you take any pills for your diabetes? If Yes, ask: What is the name of the medicine(s) that you take for high blood sugar?     As prescribed  8. OTHER SYMPTOMS: Do you have any symptoms? (e.g., fever, frequent urination, difficulty breathing, vomiting)     Feels improved after eating  9. LOW BLOOD GLUCOSE TREATMENT: What have you done so far to treat the low blood glucose level?     Toast, raisins, orange juice 10. FOOD: When did you last eat or drink?       Toast, raisins this morning  11. ALONE: Are you alone right now or is someone with you?        With spouse  140/82 left 134/84 right  Answer Assessment - Initial Assessment Questions 1. DESCRIPTION: Describe your dizziness.     Lightheaded off balance 2. LIGHTHEADED: Do you feel lightheaded? (e.g., somewhat faint, woozy, weak upon standing)     Yes this morning 3. VERTIGO: Do you feel like either you or the room is spinning or tilting? (i.e., vertigo)      4. SEVERITY: How bad is it?  Do you feel like you are going to faint? Can you stand and walk?     Now  resolved 5. ONSET:  When did the dizziness begin?     Upon awakening 6. AGGRAVATING FACTORS: Does anything make it worse? (e.g., standing, change in head position)     ambulating 7. HEART RATE: Can you tell me your heart rate? How many beats in 15 seconds?  (Note: Not all patients can do this.)       Feels normal  8. CAUSE: What do you think is causing the dizziness? (e.g., decreased fluids or food, diarrhea, emotional distress, heat exposure, new medicine, sudden standing, vomiting; unknown)     Low blood sugar 9. RECURRENT SYMPTOM: Have you had dizziness before? If Yes,  ask: When was the last time? What happened that time?     no 10. OTHER SYMPTOMS: Do you have any other symptoms? (e.g., fever, chest pain, vomiting, diarrhea, bleeding)       Fatigue is resolving  Protocols used: Dizziness - Lightheadedness-A-AH, Diabetes - Low Blood Sugar-A-AH Message from [place] [facility] sent at 07/01/2024 10:20 AM EST  Reason for Triage: Patient woke up this morning, dizzy. Feels like blood sugar dropped.

## 2024-07-01 NOTE — Assessment & Plan Note (Signed)
 Was diagnosed with sleep apnea in the past.  Not using CPAP machine Does not sleep well.  Nonrestorative sleep Wakes up tired.  Snores.  Occasional headaches.  Daytime somnolence Recommend reevaluation at sleep clinic Referral placed today.

## 2024-07-01 NOTE — Patient Instructions (Signed)
 Diabetes: cmo calcular los carbohidratos que necesita un adulto Diabetes: Carbohydrate Counting for Adults El recuento de carbohidratos es un mtodo para llevar un registro de la cantidad de carbohidratos que ingiere. La ingesta de carbohidratos aumenta la cantidad de azcar, tambin llamada glucosa, en la sangre. Si cuenta cuntos carbohidratos ingiere, controlar mejor su nivel de azcar en la sangre. Esto, a su vez, le ayuda a controlar la diabetes. Los carbohidratos se calculan en gramos (G) por porcin. Es importante saber la cantidad de carbohidratos (en gramos o por tamao de porcin) que puede ingerir en cada comida. Esto es Government social research officer. Un nutricionista puede ayudarlo a crear un plan de alimentacin y a calcular la cantidad de carbohidratos que debe ingerir en cada comida y colacin. Qu alimentos contienen carbohidratos? Los carbohidratos se encuentran en estos alimentos: Granos, como panes y cereales. Frijoles secos y alimentos con soja. Verduras con almidn, como papas, guisantes y maz. Joretta y jugos de frutas. Leche y Dentist. Dulces y colaciones, como pasteles, galletas, caramelos, papas fritas y refrescos. Cmo se calculan los carbohidratos de los alimentos? Hay dos maneras de calcular los carbohidratos de los alimentos. Puede leer las etiquetas o conocer el tamao de las porciones estndar de los alimentos. Puede usar cualquiera de estos mtodos o combinarlos. Usar la Air cabin crew de informacin nutricional La lista de nutrientes est incluida en las etiquetas de casi todas las bebidas y los alimentos envasados de los [place]. Incluye: El tamao de la porcin. Informacin sobre los nutrientes de cada porcin. Esto incluye los gramos de carbohidratos por porcin. Para usar la Informacin nutricional, decida cuntas porciones comer. Luego, multiplique el nmero de porciones por la cantidad de carbohidratos que tiene cada porcin. El nmero resultante son los  gramos totales de carbohidratos que ingerir. Conocer los tamaos de las porciones estndar de los alimentos Cuando consuma alimentos con carbohidratos y sin envasar, o sin la informacin nutricional en la etiqueta, debe medir las porciones para calcular los gramos de carbohidratos. Dosifique los alimentos que consumir con una balanza de alimentos o una taza medidora si es necesario. Decida cuntas porciones de Programmer, systems. Multiplique el nmero de porciones por 15. En los alimentos con carbohidratos, una porcin equivale a 15 G de carbohidratos. Por ejemplo, si consume 2 tazas o 10 OZ (300 G) de fresas, habr ingerido 2 porciones y 26 G de carbohidratos (2 porciones x 15 G = 30 G). Para las comidas que contienen mezclas de ms de un alimento, como las sopas y los guisos, debe calcular los carbohidratos de cada alimento. A continuacin, se presenta una lista de los tamaos estndar de las porciones de alimentos ricos en carbohidratos. Cada una de estas porciones tiene alrededor de 15 G de carbohidratos: 1 rebanada de pan. 1 tortilla de seis pulgadas (15 CM). ? de taza o 2 OZ (53 G) de arroz o pasta cocidos.  taza o 3 OZ (85 G) de lentejas o frijoles cocidos o enlatados, escurridos y enjuagados.  taza o 3 OZ (85 G) de verduras con almidn, como guisantes, maz o zapallo.  taza o 4 OZ (120 G) de cereal cocinado.  taza o 3 OZ (85 G) de papas hervidas o pur, o  o 3 OZ (85 G) de una papa grande al horno.  taza o 4 OZ fluidas (118 ML) de jugo de frutas. 1 taza u 8 OZ fluidas (237 ML) de [place]. 1 unidad pequea o 4 OZ (106 G) de manzana.  unidad o 2 OZ (63  G) de una banana mediana. 1 taza o 5 OZ (150 G) de fresas. 3 tazas o 1 OZ (28.3 G) de palomitas de maz. Cul sera un ejemplo para calcular los carbohidratos? Para calcular los gramos de carbohidratos del siguiente ejemplo, siga los pasos descritos a continuacin. Ejemplo de comida 3 OZ (85 G) de pechugas de pollo. ? de  taza o 4 OZ (106 G) de arroz integral.  taza o 3 OZ (85 G) de maz. 1 taza u 8 OZ fluidas (237 ML) de [place]. 1 taza o 5 OZ (150 G) de fresas con crema batida sin azcar. Clculo de carbohidratos Identifique los alimentos con carbohidratos: Arroz. Maz. Leche. Jaclynn. Calcule cuntas porciones come de cada alimento: 2 porciones de arroz. 1 porcin de maz. 1 porcin de leche. 1 porcin de fresas. Multiplique el nmero de porciones por 15 G: 2 porciones de arroz x 15 G = 30 G. 1 porcin de maz x 15 G = 15 G. 1 porcin de leche x 15 G = 15 G. 1 porcin de fresas x 15 G = 15 G. Sume todas las cantidades para calcular los gramos totales de carbohidratos ingeridos: 30 G + 15 G + 15 G + 15 G = 75 G de carbohidratos totales. Dnde obtener ms informacin Para obtener ms informacin, visite los siguientes sitios web: American Diabetes Association (Asociacin Estadounidense de Diabetes): diabetes.org. Haga clic en "Search" (buscar) y escriba "carb counting" (recuento de carbohidratos). Busque el enlace que necesita. Centers for Disease Control and Prevention (Centros para el Control y la Prevencin de [place]): TonerPromos.no. Haga clic en "Search" (buscar) y escriba "diabetes". Busque el enlace que necesita. Academy of Nutrition and Dietetics (Academia de Nutricin y Pension scheme manager): eatright.org Esta informacin no tiene Theme park manager el consejo del mdico. Asegrese de hacerle al mdico cualquier pregunta que tenga. Document Revised: 06/20/2023 Document Reviewed: 06/20/2023 Elsevier Patient Education  2025 [employer].

## 2024-07-01 NOTE — Assessment & Plan Note (Signed)
 BP Readings from Last 3 Encounters:  07/01/24 (!) 142/80  05/05/24 122/70  08/23/23 138/82  Missed couple doses of medication Presently on amlodipine  10 mg daily Also taking lisinopril  40 mg daily and hydrochlorothiazide  25 mg daily Lab Results  Component Value Date   HGBA1C 7.4 (H) 05/05/2024  Not on any diabetic medication Recommend to start metformin  500 mg twice a day May benefit from GLP-1 agonist Diet and nutrition discussed Cardiovascular risks associated with hypertension and diabetes discussed Benefits of exercise discussed Recommend follow-up in 3 months

## 2024-07-01 NOTE — Assessment & Plan Note (Signed)
 Lab Results  Component Value Date   HGBA1C 7.4 (H) 05/05/2024  Recommend to start metformin  500 mg twice a day Continue atorvastatin  40 mg daily Diet and nutrition discussed Cardiovascular risks associated with dyslipidemia discussed Lipid profile done today Recommend follow-up in 3 months

## 2024-07-01 NOTE — Telephone Encounter (Signed)
 Copied from CRM [id number]. Topic: Clinical - Prescription Issue >> Jul 01, 2024 10:18 AM Zachary Stephenson wrote: Reason for CRM: lisinopril -hydrochlorothiazide  (ZESTORETIC ) 20-25 MG tablet needs to [facility] re [facility]. Stating they did not receive it.

## 2024-07-01 NOTE — Assessment & Plan Note (Signed)
 Associated with hypertension and diabetes Diet and nutrition discussed Advised to decrease amount of daily carbohydrate intake and daily calories and increase amount of plant-based protein in his diet Benefits of exercise discussed

## 2024-07-01 NOTE — Progress Notes (Signed)
 Zachary Stephenson 69 y.o.   Chief Complaint  Patient presents with   Dizziness    Patient wife think that his blood sugars went down and this is what is causing the dizziness. Bp was taken at home and it was normal    HISTORY OF PRESENT ILLNESS: Acute problem visit today. This is a 69 y.o. male complaining of getting up this morning 5 AM of feeling lightheaded and dizzy.  Episode lasted about 2 hours.  Then it went away.  Asymptomatic at present time No other associated symptoms History of diabetes, hypertension, and dyslipidemia History of obstructive sleep apnea but not being treated  Dizziness Pertinent negatives include no abdominal pain, chest pain, congestion, coughing, headaches, nausea, rash, sore throat or vomiting.     Prior to Admission medications  Medication Sig Start Date End Date Taking? Authorizing Provider  amLODipine  (NORVASC ) 10 MG tablet Take 1 tablet (10 mg total) by mouth daily. 05/01/24  Yes Rollene Almarie LABOR, MD  aspirin  81 MG tablet Take 81 mg by mouth daily.   Yes [provider]  atorvastatin  (LIPITOR) 40 MG tablet Take 1 tablet (40 mg total) by mouth daily. 05/01/24  Yes Rollene Almarie LABOR, MD  lisinopril  (ZESTRIL ) 20 MG tablet Take 1 tablet (20 mg total) by mouth daily. 05/01/24  Yes Rollene Almarie LABOR, MD  lisinopril -hydrochlorothiazide  (ZESTORETIC ) 20-25 MG tablet Take 1 tablet by mouth daily. 06/23/24  Yes Rollene Almarie LABOR, MD  baclofen  (LIORESAL ) 10 MG tablet Take 1 tablet (10 mg total) by mouth 3 (three) times daily. Patient not taking: Reported on 07/01/2024 05/19/22   Rollene Almarie LABOR, MD  meloxicam  (MOBIC ) 15 MG tablet Take 1 tablet (15 mg total) by mouth daily. Patient not taking: Reported on 07/01/2024 05/01/24   Rollene Almarie LABOR, MD  METRONIDAZOLE , TOPICAL, 0.75 % LOTN Use daily on face Patient not taking: Reported on 07/01/2024 05/05/24   Rollene Almarie LABOR, MD  OZEMPIC , 0.25 OR 0.5 MG/DOSE, 2 MG/3ML SOPN INJECT  0.25 MG UNDER THE SKIN ONCE A WEEK FOR 28 DAYS THEN 0.5 MG UNDER THE SKIN ONCE A WEEK FOR 28 DAYS Patient not taking: Reported on 05/05/2024 11/19/23   Rollene Almarie LABOR, MD  predniSONE  (DELTASONE ) 20 MG tablet Take 2 tablets (40 mg total) by mouth daily with breakfast. Patient not taking: Reported on 07/01/2024 05/05/24   Rollene Almarie LABOR, MD  tadalafil  (CIALIS ) 20 MG tablet TAKE 1 TABLET BY MOUTH DAILY AS NEEDED FOR ERECTILE DYSFUNCTION. Patient not taking: Reported on 05/05/2024 04/15/21   Rollene Almarie LABOR, MD  triamcinolone  cream (KENALOG ) 0.1 % Apply 1 Application topically 2 (two) times daily. Patient not taking: Reported on 07/01/2024 05/05/24   Rollene Almarie LABOR, MD    Allergies[1]  Patient Active Problem List   Diagnosis Date Noted   Left shoulder pain 05/05/2024   Rosacea 05/05/2024   Chronic pain of both knees 05/19/2022   Hearing loss 05/19/2022   Routine general medical examination at a health care facility 04/15/2021   Osteoarthritis 04/15/2021   ED (erectile dysfunction) 02/26/2017   Morbid obesity (HCC) 11/04/2015   Hypertension associated with diabetes (HCC) 04/14/2013   Hyperlipidemia associated with type 2 diabetes mellitus (HCC) 04/14/2013   Dyslipidemia associated with type 2 diabetes mellitus (HCC) 04/14/2013    Past Medical History:  Diagnosis Date   Bimalleolar fracture of left ankle    Dyspnea on exertion    HTN (hypertension)    Hyperlipidemia    Hypertension    Sleep apnea  supposed to use CPAP, does not. Will bring DOS   Type 2 diabetes mellitus (HCC)    no meds at present    Past Surgical History:  Procedure Laterality Date   ANKLE SURGERY     right ankle,both ankles   ORIF ANKLE FRACTURE Left 07/06/2014   Procedure: OPEN REDUCTION INTERNAL FIXATION (ORIF) LEFT BIMALLEOLAR ANKLE FRACTURE;  Surgeon: Kay Ozell Cummins, MD;  Location: [facility];  Service: Orthopedics;  Laterality: Left;    Social History    Socioeconomic History   Marital status: Married    Spouse name: Not on file   Number of children: Not on file   Years of education: Not on file   Highest education level: GED or equivalent  Occupational History   Not on file  Tobacco Use   Smoking status: Never   Smokeless tobacco: Never  Substance and Sexual Activity   Alcohol use: Yes    Comment: 3-4 beers weekly   Drug use: No   Sexual activity: Not on file  Other Topics Concern   Not on file  Social History Narrative   Not on file   Social Drivers of Health   Tobacco Use: Low Risk (07/01/2024)   Patient History    Smoking Tobacco Use: Never    Smokeless Tobacco Use: Never    Passive Exposure: Not on file  Financial Resource Strain: Medium Risk (07/01/2024)   Overall Financial Resource Strain (CARDIA)    Difficulty of Paying Living Expenses: Somewhat hard  Food Insecurity: Food Insecurity Present (07/01/2024)   Epic    Worried About Programme Researcher, Broadcasting/film/video in the Last Year: Sometimes true    Ran Out of Food in the Last Year: Sometimes true  Transportation Needs: No Transportation Needs (07/01/2024)   Epic    Lack of Transportation (Medical): No    Lack of Transportation (Non-Medical): No  Physical Activity: Inactive (07/01/2024)   Exercise Vital Sign    Days of Exercise per Week: 0 days    Minutes of Exercise per Session: Not on file  Stress: Stress Concern Present (07/01/2024)   [employer] of Occupational Health - Occupational Stress Questionnaire    Feeling of Stress: Very much  Social Connections: Moderately Isolated (07/01/2024)   Social Connection and Isolation Panel    Frequency of Communication with Friends and Family: More than three times a week    Frequency of Social Gatherings with Friends and Family: Twice a week    Attends Religious Services: Never    Database Administrator or Organizations: No    Attends Engineer, Structural: Not on file    Marital Status: Married  Catering Manager  Violence: Not on file  Depression (PHQ2-9): Medium Risk (05/05/2024)   Depression (PHQ2-9)    PHQ-2 Score: 5  Alcohol Screen: Low Risk (07/01/2024)   Alcohol Screen    Last Alcohol Screening Score (AUDIT): 1  Housing: Low Risk (07/01/2024)   Epic    Unable to Pay for Housing in the Last Year: No    Number of Times Moved in the Last Year: 0    Homeless in the Last Year: No  Utilities: Not on file  Health Literacy: Not on file    Family History  Problem Relation Age of Onset   Heart attack Father    Prostate cancer Brother 32   Colon cancer Neg Hx      Review of Systems  HENT: Negative.  Negative for congestion and sore throat.  Respiratory: Negative.  Negative for cough and shortness of breath.   Cardiovascular: Negative.  Negative for chest pain and palpitations.  Gastrointestinal:  Negative for abdominal pain, diarrhea, nausea and vomiting.  Genitourinary: Negative.  Negative for dysuria.  Skin: Negative.  Negative for rash.  Neurological:  Positive for dizziness. Negative for sensory change, speech change, focal weakness and headaches.    Vitals:   07/01/24 1331  BP: (!) 142/80  Pulse: 82  Temp: 97.9 F (36.6 C)  SpO2: 98%    Physical Exam Vitals reviewed.  Constitutional:      Appearance: Normal appearance.  HENT:     Head: Normocephalic.     Mouth/Throat:     Mouth: Mucous membranes are moist.     Pharynx: Oropharynx is clear.  Eyes:     Extraocular Movements: Extraocular movements intact.     Pupils: Pupils are equal, round, and reactive to light.  Cardiovascular:     Rate and Rhythm: Normal rate and regular rhythm.     Pulses: Normal pulses.     Heart sounds: Normal heart sounds.  Pulmonary:     Effort: Pulmonary effort is normal.     Breath sounds: Normal breath sounds.  Abdominal:     Palpations: Abdomen is soft.     Tenderness: There is no abdominal tenderness.  Musculoskeletal:     Cervical back: No tenderness.     Right lower leg: No edema.      Left lower leg: No edema.  Lymphadenopathy:     Cervical: No cervical adenopathy.  Skin:    General: Skin is warm and dry.     Capillary Refill: Capillary refill takes less than 2 seconds.  Neurological:     General: No focal deficit present.     Mental Status: He is alert and oriented to person, place, and time.  Psychiatric:        Mood and Affect: Mood normal.        Behavior: Behavior normal.    Wt Readings from Last 3 Encounters:  07/01/24 290 lb (131.5 kg)  05/05/24 288 lb 3.2 oz (130.7 kg)  08/23/23 291 lb (132 kg)   Lab Results  Component Value Date   HGBA1C 7.4 (H) 05/05/2024     EKG: Normal sinus rhythm with ventricular rate of 80/min.  No acute ischemic changes.  ASSESSMENT & PLAN: A total of 44 minutes was spent with the patient and counseling/coordination of care regarding preparing for this visit, review of most recent office visit notes, review of multiple chronic medical conditions and their management, review of all medications, cardiovascular risks associated with diabetes and need for medication, review of most recent bloodwork results, review of health maintenance items, education on nutrition, prognosis, documentation, and need for follow up.   Problem List Items Addressed This Visit       Cardiovascular and Mediastinum   Hypertension associated with diabetes (HCC) - Primary   BP Readings from Last 3 Encounters:  07/01/24 (!) 142/80  05/05/24 122/70  08/23/23 138/82  Missed couple doses of medication Presently on amlodipine  10 mg daily Also taking lisinopril  40 mg daily and hydrochlorothiazide  25 mg daily Lab Results  Component Value Date   HGBA1C 7.4 (H) 05/05/2024  Not on any diabetic medication Recommend to start metformin  500 mg twice a day May benefit from GLP-1 agonist Diet and nutrition discussed Cardiovascular risks associated with hypertension and diabetes discussed Benefits of exercise discussed Recommend follow-up in 3  months  Relevant Medications   lisinopril -hydrochlorothiazide  (ZESTORETIC ) 20-25 MG tablet   metFORMIN  (GLUCOPHAGE ) 500 MG tablet   Other Relevant Orders   CBC with Differential/Platelet   Comprehensive metabolic panel with GFR   Hemoglobin A1c   Lipid panel   Vitamin B12   VITAMIN D 25 Hydroxy (Vit-D Deficiency, Fractures)   PSA   Comprehensive metabolic panel   PSA     Respiratory   Sleep apnea   Was diagnosed with sleep apnea in the past.  Not using CPAP machine Does not sleep well.  Nonrestorative sleep Wakes up tired.  Snores.  Occasional headaches.  Daytime somnolence Recommend reevaluation at sleep clinic Referral placed today.        Endocrine   Dyslipidemia associated with type 2 diabetes mellitus (HCC)   Lab Results  Component Value Date   HGBA1C 7.4 (H) 05/05/2024  Recommend to start metformin  500 mg twice a day Continue atorvastatin  40 mg daily Diet and nutrition discussed Cardiovascular risks associated with dyslipidemia discussed Lipid profile done today Recommend follow-up in 3 months       Relevant Medications   lisinopril -hydrochlorothiazide  (ZESTORETIC ) 20-25 MG tablet   metFORMIN  (GLUCOPHAGE ) 500 MG tablet   Other Relevant Orders   CBC with Differential/Platelet   Comprehensive metabolic panel with GFR   Hemoglobin A1c   Lipid panel   Vitamin B12   VITAMIN D 25 Hydroxy (Vit-D Deficiency, Fractures)   PSA   Hemoglobin A1c   Lipid panel     Other   Morbid obesity (HCC)   Associated with hypertension and diabetes Diet and nutrition discussed Advised to decrease amount of daily carbohydrate intake and daily calories and increase amount of plant-based protein in his diet Benefits of exercise discussed      Relevant Medications   metFORMIN  (GLUCOPHAGE ) 500 MG tablet   Left shoulder pain   Chronic pain Takes meloxicam  15 mg as needed Pain management discussed Needs orthopedic evaluation      Relevant Orders   Ambulatory  referral to Orthopedic Surgery   Lightheadedness   Clinically stable.  No red flag signs or symptoms Unremarkable physical examination.  Stable vital signs Normal EKG. Benign presentation.  Differential diagnosis discussed Recommend blood work today Advised to stay well-hydrated and rest      Relevant Orders   CBC with Differential/Platelet   Comprehensive metabolic panel with GFR   Hemoglobin A1c   Lipid panel   Vitamin B12   VITAMIN D 25 Hydroxy (Vit-D Deficiency, Fractures)   PSA   Vitamin D (25 hydroxy)   Vitamin B12   History of obstructive sleep apnea   Relevant Orders   Ambulatory referral to Sleep Studies   Patient Instructions  Diabetes: cmo calcular los carbohidratos que necesita un adulto Diabetes: Carbohydrate Counting for Adults El recuento de carbohidratos es un mtodo para llevar un registro de la cantidad de carbohidratos que ingiere. La ingesta de carbohidratos aumenta la cantidad de azcar, tambin llamada glucosa, en la sangre. Si cuenta cuntos carbohidratos ingiere, controlar mejor su nivel de azcar en la sangre. Esto, a su vez, le ayuda a controlar la diabetes. Los carbohidratos se calculan en gramos (G) por porcin. Es importante saber la cantidad de carbohidratos (en gramos o por tamao de porcin) que puede ingerir en cada comida. Esto es government social research officer. Un nutricionista puede ayudarlo a crear un plan de alimentacin y a calcular la cantidad de carbohidratos que debe ingerir en cada comida y colacin. Qu alimentos contienen carbohidratos? Los carbohidratos se  encuentran en estos alimentos: Granos, como panes y cereales. Frijoles secos y alimentos con soja. Verduras con almidn, como papas, guisantes y maz. Joretta y jugos de frutas. Leche y dentist. Dulces y colaciones, como pasteles, galletas, caramelos, papas fritas y refrescos. Cmo se calculan los carbohidratos de los alimentos? Hay dos maneras de calcular los carbohidratos de los  alimentos. Puede leer las etiquetas o conocer el tamao de las porciones estndar de los alimentos. Puede usar cualquiera de estos mtodos o combinarlos. Usar la air cabin crew de informacin nutricional La lista de nutrientes est incluida en las etiquetas de casi todas las bebidas y los alimentos envasados de los [place]. Incluye: El tamao de la porcin. Informacin sobre los nutrientes de cada porcin. Esto incluye los gramos de carbohidratos por porcin. Para usar la Informacin nutricional, decida cuntas porciones comer. Luego, multiplique el nmero de porciones por la cantidad de carbohidratos que tiene cada porcin. El nmero resultante son los gramos totales de carbohidratos que ingerir. Conocer los tamaos de las porciones estndar de los alimentos Cuando consuma alimentos con carbohidratos y sin envasar, o sin la informacin nutricional en la etiqueta, debe medir las porciones para calcular los gramos de carbohidratos. Dosifique los alimentos que consumir con una balanza de alimentos o una taza medidora si es necesario. Decida cuntas porciones de programmer, systems. Multiplique el nmero de porciones por 15. En los alimentos con carbohidratos, una porcin equivale a 15 G de carbohidratos. Por ejemplo, si consume 2 tazas o 10 OZ (300 G) de fresas, habr ingerido 2 porciones y 24 G de carbohidratos (2 porciones x 15 G = 30 G). Para las comidas que contienen mezclas de ms de un alimento, como las sopas y los guisos, debe calcular los carbohidratos de cada alimento. A continuacin, se presenta una lista de los tamaos estndar de las porciones de alimentos ricos en carbohidratos. Cada una de estas porciones tiene alrededor de 15 G de carbohidratos: 1 rebanada de pan. 1 tortilla de seis pulgadas (15 CM). ? de taza o 2 OZ (53 G) de arroz o pasta cocidos.  taza o 3 OZ (85 G) de lentejas o frijoles cocidos o enlatados, escurridos y enjuagados.  taza o 3 OZ (85 G) de verduras con  almidn, como guisantes, maz o zapallo.  taza o 4 OZ (120 G) de cereal cocinado.  taza o 3 OZ (85 G) de papas hervidas o pur, o  o 3 OZ (85 G) de una papa grande al horno.  taza o 4 OZ fluidas (118 ML) de jugo de frutas. 1 taza u 8 OZ fluidas (237 ML) de [place]. 1 unidad pequea o 4 OZ (106 G) de manzana.  unidad o 2 OZ (63 G) de una banana mediana. 1 taza o 5 OZ (150 G) de fresas. 3 tazas o 1 OZ (28.3 G) de palomitas de maz. Cul sera un ejemplo para calcular los carbohidratos? Para calcular los gramos de carbohidratos del siguiente ejemplo, siga los pasos descritos a continuacin. Ejemplo de comida 3 OZ (85 G) de pechugas de pollo. ? de taza o 4 OZ (106 G) de arroz integral.  taza o 3 OZ (85 G) de maz. 1 taza u 8 OZ fluidas (237 ML) de leche. 1 taza o 5 OZ (150 G) de fresas con crema batida sin azcar. Clculo de carbohidratos Identifique los alimentos con carbohidratos: Arroz. Maz. Leche. Jaclynn. Calcule cuntas porciones come de cada alimento: 2 porciones de arroz. 1 porcin de maz. 1 porcin de leche. 1 porcin de  fresas. Multiplique el nmero de porciones por 15 G: 2 porciones de arroz x 15 G = 30 G. 1 porcin de maz x 15 G = 15 G. 1 porcin de leche x 15 G = 15 G. 1 porcin de fresas x 15 G = 15 G. Sume todas las cantidades para calcular los gramos totales de carbohidratos ingeridos: 30 G + 15 G + 15 G + 15 G = 75 G de carbohidratos totales. Dnde obtener ms informacin Para obtener ms informacin, visite los siguientes sitios web: American Diabetes Association (Asociacin Estadounidense de Diabetes): diabetes.org. Haga Physiological Scientist (buscar) y escriba carb counting (recuento de carbohidratos). Busque el enlace que necesita. Centers for Disease Control and Prevention (Centros para el Control y la Prevencin de [place]): tonerpromos.no. Haga Physiological Scientist (buscar) y escriba diabetes. Busque el enlace que necesita. Academy of Nutrition and  Dietetics (Academia de Nutricin y Pension Scheme Manager): eatright.org Esta informacin no tiene theme park manager el consejo del mdico. Asegrese de hacerle al mdico cualquier pregunta que tenga. Document Revised: 06/20/2023 Document Reviewed: 06/20/2023 Elsevier Patient Education  2025 Elsevier Inc.     Emil Schaumann, MD [facility]     [1] No Known Allergies

## 2024-07-04 NOTE — Telephone Encounter (Signed)
 Rx was sent and was received on 07/01/24@2 :08pm.

## 2024-07-10 ENCOUNTER — Ambulatory Visit: Admitting: Orthopaedic Surgery

## 2024-07-10 ENCOUNTER — Other Ambulatory Visit (INDEPENDENT_AMBULATORY_CARE_PROVIDER_SITE_OTHER): Payer: Self-pay

## 2024-07-10 DIAGNOSIS — M25512 Pain in left shoulder: Secondary | ICD-10-CM

## 2024-07-10 MED ORDER — METHYLPREDNISOLONE ACETATE 40 MG/ML IJ SUSP
40.0000 mg | INTRAMUSCULAR | Status: AC | PRN
  Administered 2024-07-10: 40 mg via INTRA_ARTICULAR

## 2024-07-10 MED ORDER — BUPIVACAINE HCL 0.5 % IJ SOLN
3.0000 mL | INTRAMUSCULAR | Status: AC | PRN
  Administered 2024-07-10: 3 mL via INTRA_ARTICULAR

## 2024-07-10 MED ORDER — LIDOCAINE HCL 1 % IJ SOLN
3.0000 mL | INTRAMUSCULAR | Status: AC | PRN
  Administered 2024-07-10: 3 mL

## 2024-07-10 NOTE — Progress Notes (Signed)
 "  Office Visit Note   Patient: Zachary Stephenson           Date of Birth: July 04, 1955           MRN: [id number] Visit Date: 07/10/2024              Requested by: Purcell Emil Schanz, MD [address] [place],  [place] [address] PCP: Purcell Emil Schanz, MD   Assessment & Plan: Visit Diagnoses:  1. Left shoulder pain, unspecified chronicity     Plan: Impression is left shoulder pain likely due to impingement syndrome and rotator cuff tendinosis.  Treatment options were discussed and we will try a course of outpatient physical therapy and a subacromial injection.  He tolerated the injection well.  He will follow-up in about 6 weeks if symptoms do not improve.  Follow-Up Instructions: Return if symptoms worsen or fail to improve.   Orders:  Orders Placed This Encounter  Procedures   Large Joint Inj   XR Shoulder Left   Ambulatory referral to Physical Therapy   No orders of the defined types were placed in this encounter.     Procedures: Large Joint Inj: L subacromial bursa on 07/10/2024 3:40 PM Indications: pain Details: 22 G needle  Arthrogram: No  Medications: 3 mL lidocaine  1 %; 3 mL bupivacaine  0.5 %; 40 mg methylPREDNISolone  acetate 40 MG/ML Outcome: tolerated well, no immediate complications Patient was prepped and draped in the usual sterile fashion.       Clinical Data: No additional findings.   Subjective: Chief Complaint  Patient presents with   Left Shoulder - Pain    HPI Patient is a 69 year old gentleman here for evaluation of left shoulder pain for some months.  He has throbbing pain that goes up and down the arm when he raises his arm.  Has sleep disturbance as a result.  Has been using topical ointments.  He has not felt much relief from this.  Denies any radicular symptoms. Review of Systems  Constitutional: Negative.   HENT: Negative.    Eyes: Negative.   Respiratory: Negative.    Cardiovascular: Negative.   Gastrointestinal:  Negative.   Endocrine: Negative.   Genitourinary: Negative.   Skin: Negative.   Allergic/Immunologic: Negative.   Neurological: Negative.   Hematological: Negative.   Psychiatric/Behavioral: Negative.    All other systems reviewed and are negative.    Objective: Vital Signs: There were no vitals taken for this visit.  Physical Exam Vitals and nursing note reviewed.  Constitutional:      Appearance: He is well-developed.  HENT:     Head: Normocephalic and atraumatic.  Eyes:     Pupils: Pupils are equal, round, and reactive to light.  Pulmonary:     Effort: Pulmonary effort is normal.  Abdominal:     Palpations: Abdomen is soft.  Musculoskeletal:        General: Normal range of motion.     Cervical back: Neck supple.  Skin:    General: Skin is warm.  Neurological:     Mental Status: He is alert and oriented to person, place, and time.  Psychiatric:        Behavior: Behavior normal.        Thought Content: Thought content normal.        Judgment: Judgment normal.     Ortho Exam Exam of the left shoulder shows is normal range of motion is symmetric to the contralateral shoulder.  He has pain with Neer and Vonzell  impingement sign.  Pain with manual muscle testing the supraspinatus without significant weakness. Specialty Comments:  No specialty comments available.  Imaging: XR Shoulder Left Result Date: 07/10/2024 X-rays of the left shoulder show a large anterior acromial spur.  Degenerative changes in the [facility] joint.  Glenohumeral space is preserved.    PMFS History: Patient Active Problem List   Diagnosis Date Noted   Lightheadedness 07/01/2024   History of obstructive sleep apnea 07/01/2024   Sleep apnea    Left shoulder pain 05/05/2024   Rosacea 05/05/2024   Chronic pain of both knees 05/19/2022   Hearing loss 05/19/2022   Osteoarthritis 04/15/2021   ED (erectile dysfunction) 02/26/2017   Morbid obesity (HCC) 11/04/2015   Hypertension associated with  diabetes (HCC) 04/14/2013   Hyperlipidemia associated with type 2 diabetes mellitus (HCC) 04/14/2013   Dyslipidemia associated with type 2 diabetes mellitus (HCC) 04/14/2013   Past Medical History:  Diagnosis Date   Bimalleolar fracture of left ankle    Dyspnea on exertion    HTN (hypertension)    Hyperlipidemia    Hypertension    Sleep apnea     supposed to use CPAP, does not. Will bring DOS   Type 2 diabetes mellitus (HCC)    no meds at present    Family History  Problem Relation Age of Onset   Heart attack Father    Prostate cancer Brother 80   Colon cancer Neg Hx     Past Surgical History:  Procedure Laterality Date   ANKLE SURGERY     right ankle,both ankles   ORIF ANKLE FRACTURE Left 07/06/2014   Procedure: OPEN REDUCTION INTERNAL FIXATION (ORIF) LEFT BIMALLEOLAR ANKLE FRACTURE;  Surgeon: Kay Ozell Cummins, MD;  Location: [facility];  Service: Orthopedics;  Laterality: Left;   Social History   Occupational History   Not on file  Tobacco Use   Smoking status: Never   Smokeless tobacco: Never  Substance and Sexual Activity   Alcohol use: Yes    Comment: 3-4 beers weekly   Drug use: No   Sexual activity: Not on file        "

## 2024-07-11 NOTE — Addendum Note (Signed)
 Addended by: Olman Yono S on: 07/11/2024 10:39 AM   Modules accepted: Orders

## 2024-07-17 ENCOUNTER — Telehealth: Payer: Self-pay

## 2024-07-17 ENCOUNTER — Other Ambulatory Visit (INDEPENDENT_AMBULATORY_CARE_PROVIDER_SITE_OTHER)

## 2024-07-17 DIAGNOSIS — I152 Hypertension secondary to endocrine disorders: Secondary | ICD-10-CM

## 2024-07-17 DIAGNOSIS — E1159 Type 2 diabetes mellitus with other circulatory complications: Secondary | ICD-10-CM | POA: Diagnosis not present

## 2024-07-17 DIAGNOSIS — R42 Dizziness and giddiness: Secondary | ICD-10-CM | POA: Diagnosis not present

## 2024-07-17 DIAGNOSIS — E1169 Type 2 diabetes mellitus with other specified complication: Secondary | ICD-10-CM

## 2024-07-17 LAB — PSA: PSA: 1.09 ng/mL (ref 0.10–4.00)

## 2024-07-17 LAB — VITAMIN D 25 HYDROXY (VIT D DEFICIENCY, FRACTURES): VITD: 15.77 ng/mL — ABNORMAL LOW (ref 30.00–100.00)

## 2024-07-17 NOTE — Telephone Encounter (Signed)
 Correct.  He takes a total of 40 mg of lisinopril  daily and a total of 25 mg of hydrochlorothiazide  daily.  So he takes 1 tablet of 20 mg lisinopril  and 1 tablet of 20/25 lisinopril -hydrochlorothiazide .

## 2024-07-17 NOTE — Telephone Encounter (Signed)
 Called [facility] - Marcey back. Left detailed message, okay to fill both, on secure voicemail.

## 2024-07-17 NOTE — Telephone Encounter (Signed)
 Received call from Marcey with [facility] pharmacy needing clarity around patient medications.  Patient has Lisniopril 20mg  every day and Lisinopril -hydrochlorothiazide  20-25mg  every day  Wife states patient supposed to take 40 of Lisinopril  and then 25mg  of the Hydrochlorothiazde, but pharmacy wants to verify before filling.   Marcey: [phone]  Ext. [phone]  Please provide clarity and call will be returned to ger RX filled.  Patient has seen both of you so I am unsure of whom to best answer this.

## 2024-07-18 LAB — LIPID PANEL
Cholesterol: 128 mg/dL
HDL: 48 mg/dL
LDL Cholesterol (Calc): 62 mg/dL
Non-HDL Cholesterol (Calc): 80 mg/dL
Total CHOL/HDL Ratio: 2.7 (calc)
Triglycerides: 99 mg/dL

## 2024-07-18 LAB — COMPREHENSIVE METABOLIC PANEL WITH GFR
AG Ratio: 2.1 (calc) (ref 1.0–2.5)
ALT: 28 U/L (ref 9–46)
AST: 15 U/L (ref 10–35)
Albumin: 5.1 g/dL (ref 3.6–5.1)
Alkaline phosphatase (APISO): 82 U/L (ref 35–144)
BUN: 21 mg/dL (ref 7–25)
CO2: 30 mmol/L (ref 20–32)
Calcium: 10.1 mg/dL (ref 8.6–10.3)
Chloride: 98 mmol/L (ref 98–110)
Creat: 0.85 mg/dL (ref 0.70–1.35)
Globulin: 2.4 g/dL (ref 1.9–3.7)
Glucose, Bld: 134 mg/dL — ABNORMAL HIGH (ref 65–99)
Potassium: 4.3 mmol/L (ref 3.5–5.3)
Sodium: 138 mmol/L (ref 135–146)
Total Bilirubin: 0.6 mg/dL (ref 0.2–1.2)
Total Protein: 7.5 g/dL (ref 6.1–8.1)
eGFR: 95 mL/min/{1.73_m2}

## 2024-07-18 LAB — VITAMIN B12: Vitamin B-12: 427 pg/mL (ref 200–1100)

## 2024-07-24 ENCOUNTER — Ambulatory Visit

## 2024-08-07 ENCOUNTER — Institutional Professional Consult (permissible substitution): Admitting: Neurology
